# Patient Record
Sex: Male | Born: 1940 | Race: White | Hispanic: No | Marital: Married | Smoking: Former smoker
Health system: Southern US, Community
[De-identification: ages and names within clinical notes are randomized; demographics above are authoritative.]

## PROBLEM LIST (undated history)

## (undated) DIAGNOSIS — G459 Transient cerebral ischemic attack, unspecified: Secondary | ICD-10-CM

## (undated) DIAGNOSIS — E7800 Pure hypercholesterolemia, unspecified: Secondary | ICD-10-CM

## (undated) DIAGNOSIS — F329 Major depressive disorder, single episode, unspecified: Secondary | ICD-10-CM

## (undated) DIAGNOSIS — E669 Obesity, unspecified: Secondary | ICD-10-CM

## (undated) DIAGNOSIS — R112 Nausea with vomiting, unspecified: Secondary | ICD-10-CM

## (undated) DIAGNOSIS — H539 Unspecified visual disturbance: Secondary | ICD-10-CM

## (undated) DIAGNOSIS — F32A Depression, unspecified: Secondary | ICD-10-CM

## (undated) DIAGNOSIS — K219 Gastro-esophageal reflux disease without esophagitis: Secondary | ICD-10-CM

## (undated) DIAGNOSIS — R197 Diarrhea, unspecified: Secondary | ICD-10-CM

## (undated) DIAGNOSIS — K5792 Diverticulitis of intestine, part unspecified, without perforation or abscess without bleeding: Secondary | ICD-10-CM

## (undated) DIAGNOSIS — E785 Hyperlipidemia, unspecified: Secondary | ICD-10-CM

## (undated) DIAGNOSIS — I2510 Atherosclerotic heart disease of native coronary artery without angina pectoris: Secondary | ICD-10-CM

## (undated) DIAGNOSIS — I10 Essential (primary) hypertension: Secondary | ICD-10-CM

## (undated) DIAGNOSIS — E119 Type 2 diabetes mellitus without complications: Secondary | ICD-10-CM

## (undated) HISTORY — DX: Diverticulitis of intestine, part unspecified, without perforation or abscess without bleeding: K57.92

## (undated) HISTORY — DX: Pure hypercholesterolemia, unspecified: E78.00

## (undated) HISTORY — DX: Nausea with vomiting, unspecified: R11.2

## (undated) HISTORY — DX: Type 2 diabetes mellitus without complications: E11.9

## (undated) HISTORY — PX: APPENDECTOMY: SHX54

## (undated) HISTORY — DX: Diarrhea, unspecified: R19.7

## (undated) HISTORY — DX: Gastro-esophageal reflux disease without esophagitis: K21.9

## (undated) HISTORY — DX: Atherosclerotic heart disease of native coronary artery without angina pectoris: I25.10

## (undated) HISTORY — PX: CARDIAC CATHETERIZATION: SHX172

## (undated) HISTORY — DX: Major depressive disorder, single episode, unspecified: F32.9

## (undated) HISTORY — PX: COLONOSCOPY: SHX174

## (undated) HISTORY — PX: EYE SURGERY: SHX253

## (undated) HISTORY — PX: OTHER SURGICAL HISTORY: SHX169

## (undated) HISTORY — DX: Obesity, unspecified: E66.9

## (undated) HISTORY — PX: LUMBAR FUSION: SHX111

## (undated) HISTORY — DX: Transient cerebral ischemic attack, unspecified: G45.9

## (undated) HISTORY — DX: Essential (primary) hypertension: I10

## (undated) HISTORY — DX: Depression, unspecified: F32.A

---

## 2004-12-16 ENCOUNTER — Ambulatory Visit: Payer: Self-pay | Admitting: Family Medicine

## 2006-01-28 DIAGNOSIS — I2510 Atherosclerotic heart disease of native coronary artery without angina pectoris: Secondary | ICD-10-CM

## 2006-01-28 HISTORY — DX: Atherosclerotic heart disease of native coronary artery without angina pectoris: I25.10

## 2006-02-17 ENCOUNTER — Ambulatory Visit (HOSPITAL_COMMUNITY): Admission: RE | Admit: 2006-02-17 | Discharge: 2006-02-18 | Payer: Self-pay | Admitting: Cardiology

## 2006-02-17 ENCOUNTER — Inpatient Hospital Stay (HOSPITAL_BASED_OUTPATIENT_CLINIC_OR_DEPARTMENT_OTHER): Admission: RE | Admit: 2006-02-17 | Discharge: 2006-02-17 | Payer: Self-pay | Admitting: Cardiology

## 2007-12-01 DIAGNOSIS — G459 Transient cerebral ischemic attack, unspecified: Secondary | ICD-10-CM

## 2007-12-01 HISTORY — DX: Transient cerebral ischemic attack, unspecified: G45.9

## 2008-11-19 ENCOUNTER — Inpatient Hospital Stay: Payer: Self-pay | Admitting: Internal Medicine

## 2010-03-04 ENCOUNTER — Encounter: Admission: RE | Admit: 2010-03-04 | Discharge: 2010-03-04 | Payer: Self-pay | Admitting: Cardiology

## 2010-08-25 ENCOUNTER — Ambulatory Visit: Payer: Self-pay | Admitting: Gastroenterology

## 2010-09-01 ENCOUNTER — Ambulatory Visit: Payer: Self-pay | Admitting: Cardiology

## 2011-03-02 ENCOUNTER — Emergency Department (HOSPITAL_COMMUNITY): Payer: Medicare Other

## 2011-03-02 ENCOUNTER — Other Ambulatory Visit: Payer: Self-pay | Admitting: *Deleted

## 2011-03-02 ENCOUNTER — Observation Stay (HOSPITAL_COMMUNITY)
Admission: EM | Admit: 2011-03-02 | Discharge: 2011-03-03 | DRG: 392 | Disposition: A | Discharge status: Home / Self Care | Payer: Medicare Other | Source: Ambulatory Visit | Attending: Cardiology | Admitting: Cardiology

## 2011-03-02 DIAGNOSIS — E669 Obesity, unspecified: Secondary | ICD-10-CM | POA: Insufficient documentation

## 2011-03-02 DIAGNOSIS — R61 Generalized hyperhidrosis: Secondary | ICD-10-CM | POA: Insufficient documentation

## 2011-03-02 DIAGNOSIS — R079 Chest pain, unspecified: Principal | ICD-10-CM | POA: Insufficient documentation

## 2011-03-02 DIAGNOSIS — Z79899 Other long term (current) drug therapy: Secondary | ICD-10-CM | POA: Insufficient documentation

## 2011-03-02 DIAGNOSIS — F329 Major depressive disorder, single episode, unspecified: Secondary | ICD-10-CM | POA: Insufficient documentation

## 2011-03-02 DIAGNOSIS — I10 Essential (primary) hypertension: Secondary | ICD-10-CM

## 2011-03-02 DIAGNOSIS — E785 Hyperlipidemia, unspecified: Secondary | ICD-10-CM | POA: Insufficient documentation

## 2011-03-02 DIAGNOSIS — R0602 Shortness of breath: Secondary | ICD-10-CM | POA: Insufficient documentation

## 2011-03-02 DIAGNOSIS — F3289 Other specified depressive episodes: Secondary | ICD-10-CM | POA: Insufficient documentation

## 2011-03-02 DIAGNOSIS — R112 Nausea with vomiting, unspecified: Secondary | ICD-10-CM | POA: Insufficient documentation

## 2011-03-02 DIAGNOSIS — R110 Nausea: Secondary | ICD-10-CM | POA: Insufficient documentation

## 2011-03-02 DIAGNOSIS — I2510 Atherosclerotic heart disease of native coronary artery without angina pectoris: Secondary | ICD-10-CM | POA: Insufficient documentation

## 2011-03-02 DIAGNOSIS — Z9861 Coronary angioplasty status: Secondary | ICD-10-CM | POA: Insufficient documentation

## 2011-03-02 LAB — COMPREHENSIVE METABOLIC PANEL
ALT: 30 U/L (ref 0–53)
AST: 28 U/L (ref 0–37)
Albumin: 4 g/dL (ref 3.5–5.2)
BUN: 13 mg/dL (ref 6–23)
CO2: 28 mEq/L (ref 19–32)
Calcium: 9.1 mg/dL (ref 8.4–10.5)
Creatinine, Ser: 1.34 mg/dL (ref 0.4–1.5)
GFR calc non Af Amer: 53 mL/min — ABNORMAL LOW (ref >60)
Sodium: 138 mEq/L (ref 135–145)
Total Protein: 6.5 g/dL (ref 6.0–8.3)

## 2011-03-02 LAB — CK TOTAL AND CKMB (NOT AT ARMC): CK, MB: 3.9 ng/mL (ref 0.3–4.0)

## 2011-03-02 LAB — CBC
HCT: 44 % (ref 39.0–52.0)
MCHC: 34.5 g/dL (ref 30.0–36.0)
MCV: 92.1 fL (ref 78.0–100.0)
RDW: 12.4 % (ref 11.5–15.5)
WBC: 6.5 10*3/uL (ref 4.0–10.5)

## 2011-03-02 LAB — DIFFERENTIAL: Eosinophils Relative: 3 % (ref 0–5)

## 2011-03-02 LAB — URINALYSIS, ROUTINE W REFLEX MICROSCOPIC
Glucose, UA: NEGATIVE mg/dL
Specific Gravity, Urine: 1.017 (ref 1.005–1.030)

## 2011-03-02 LAB — TROPONIN I: Troponin I: 0.01 ng/mL (ref 0.00–0.06)

## 2011-03-02 LAB — HEMOGLOBIN A1C: Mean Plasma Glucose: 134 mg/dL — ABNORMAL HIGH (ref <117)

## 2011-03-02 LAB — POCT CARDIAC MARKERS: Myoglobin, poc: 172 ng/mL (ref 12–200)

## 2011-03-02 MED ORDER — VALSARTAN 80 MG PO TABS: 80.0000 mg | ORAL_TABLET | Freq: Every day | ORAL | Status: DC

## 2011-03-03 LAB — TSH: TSH: 1.02 u[IU]/mL (ref 0.350–4.500)

## 2011-03-03 LAB — CARDIAC PANEL(CRET KIN+CKTOT+MB+TROPI)
Relative Index: 1.2 (ref 0.0–2.5)
Troponin I: 0.01 ng/mL (ref 0.00–0.06)

## 2011-03-10 ENCOUNTER — Ambulatory Visit (HOSPITAL_COMMUNITY): Payer: Medicare Other | Attending: Cardiology | Admitting: Radiology

## 2011-03-10 DIAGNOSIS — I2510 Atherosclerotic heart disease of native coronary artery without angina pectoris: Secondary | ICD-10-CM

## 2011-03-10 DIAGNOSIS — R0789 Other chest pain: Secondary | ICD-10-CM

## 2011-03-10 DIAGNOSIS — R079 Chest pain, unspecified: Secondary | ICD-10-CM | POA: Insufficient documentation

## 2011-03-10 DIAGNOSIS — I491 Atrial premature depolarization: Secondary | ICD-10-CM

## 2011-03-10 MED ORDER — TECHNETIUM TC 99M TETROFOSMIN IV KIT
33.0000 | PACK | Freq: Once | INTRAVENOUS | Status: AC | PRN
  Administered 2011-03-10: 33 via INTRAVENOUS

## 2011-03-10 MED ORDER — TECHNETIUM TC 99M TETROFOSMIN IV KIT
11.0000 | PACK | Freq: Once | INTRAVENOUS | Status: AC | PRN
  Administered 2011-03-10: 11 via INTRAVENOUS

## 2011-03-10 NOTE — Progress Notes (Signed)
[facility] SITE 3 NUCLEAR MED [address] [place] [address] [phone]  Cardiology Nuclear Med Study  Sean [name] is a 70 y.o. male [id number] 1940/12/19   Nuclear Med Background Indication for Stress Test:  Evaluation for Ischemia, PTCA/Stent Patency  and 03/03/11 Post Hospital: CP:MI r/o. History:  '07 Stent-RCA, 3/11 OZH:YQMVHQ, EF=71% Cardiac Risk Factors: Family History - CAD, History of Smoking, Hypertension, Lipids, Obesity and TIA  Symptoms:  Chest Pressure.  (last date of chest discomfort :none since discharge), Diaphoresis, Nausea and Vomiting   Nuclear Pre-Procedure Caffeine/Decaff Intake:  None NPO After: 7:30am   Lungs:  Clear IV 0.9% NS with Angio Cath:  18g  IV Site: R Antecubital  IV Started by:  Stanton Kidney, EMT-P  Chest Size (in):  46 Cup Size: n/a  Height: 5\' 9"  (1.753 m)  Weight:  228 lb (103.42 kg)  BMI:  Body mass index is 33.67 kg/(m^2). Tech Comments:  NA    Nuclear Med Study 1 or 2 day study: 1 day  Stress Test Type:  Stress  Reading MD: Charlton Haws, MD  Order Authorizing Provider:  Roger Shelter, MD  Resting Radionuclide: Technetium 43m Tetrofosmin  Resting Radionuclide Dose: 11.0 mCi   Stress Radionuclide:  Technetium 66m Tetrofosmin  Stress Radionuclide Dose: 33.0 mCi           Stress Protocol Rest HR: 65 Stress HR: 141  Rest BP: 118/90 Stress BP: 207/92  Exercise Time (min): 8:01 METS: 10.2   Predicted Max HR: 153 bpm % Max HR: 92.16 bpm Rate Pressure Product: 46962   Dose of Adenosine (mg):  n/a Dose of Lexiscan: n/a mg  Dose of Atropine (mg): n/a Dose of Dobutamine: n/a mcg/kg/min (at max HR)  Stress Test Technologist: [employer], CMA-N  Nuclear Technologist:  Doyne Keel, CNMT     Rest Procedure:  Myocardial perfusion imaging was performed at rest 45 minutes following the intravenous administration of Technetium 66m Tetrofosmin. Rest ECG: ? Prior SWMI.  Stress Procedure:  The patient exercised  for 8:01.  The patient stopped due to fatigue and denied any chest pain.  There were no significant ST-T wave changes, only rare PAC's.  He did have a mild hypertensive response to exercise, 207/92.  Technetium 97m Tetrofosmin was injected at peak exercise and myocardial perfusion imaging was performed after a brief delay. Stress ECG: No significant ST segment change suggestive of ischemia.  QPS Raw Data Images:  Normal; no motion artifact; normal heart/lung ratio. Stress Images:  Normal homogeneous uptake in all areas of the myocardium. Rest Images:  Normal homogeneous uptake in all areas of the myocardium. Subtraction (SDS):  Normal Transient Ischemic Dilatation (Normal <1.22):  0.77 Lung/Heart Ratio (Normal <0.45):  0.34  Quantitative Gated Spect Images QGS EDV:  69 ml QGS ESV:  23 ml QGS cine images:  NL LV Function; NL Wall Motion QGS EF: 67%  Impression Exercise Capacity:  Fair exercise capacity. BP Response:  Hypertensive blood pressure response. Clinical Symptoms:  There is dyspnea. ECG Impression:  No significant ST segment change suggestive of ischemia. Comparison with Prior Nuclear Study: No images to compare  Overall Impression:  Normal stress nuclear study.    Signed by Rea [employer]co on 03/10/2011 at 2:35 PM.   Charlton Haws

## 2011-03-11 ENCOUNTER — Encounter: Payer: Self-pay | Admitting: Cardiology

## 2011-03-11 ENCOUNTER — Ambulatory Visit (INDEPENDENT_AMBULATORY_CARE_PROVIDER_SITE_OTHER): Payer: Medicare Other | Admitting: Cardiology

## 2011-03-11 DIAGNOSIS — F32A Depression, unspecified: Secondary | ICD-10-CM | POA: Insufficient documentation

## 2011-03-11 DIAGNOSIS — R079 Chest pain, unspecified: Secondary | ICD-10-CM

## 2011-03-11 DIAGNOSIS — I10 Essential (primary) hypertension: Secondary | ICD-10-CM | POA: Insufficient documentation

## 2011-03-11 DIAGNOSIS — I2510 Atherosclerotic heart disease of native coronary artery without angina pectoris: Secondary | ICD-10-CM

## 2011-03-11 DIAGNOSIS — K219 Gastro-esophageal reflux disease without esophagitis: Secondary | ICD-10-CM

## 2011-03-11 DIAGNOSIS — E669 Obesity, unspecified: Secondary | ICD-10-CM | POA: Insufficient documentation

## 2011-03-11 DIAGNOSIS — R112 Nausea with vomiting, unspecified: Secondary | ICD-10-CM | POA: Insufficient documentation

## 2011-03-11 DIAGNOSIS — E7800 Pure hypercholesterolemia, unspecified: Secondary | ICD-10-CM | POA: Insufficient documentation

## 2011-03-11 DIAGNOSIS — F329 Major depressive disorder, single episode, unspecified: Secondary | ICD-10-CM | POA: Insufficient documentation

## 2011-03-11 DIAGNOSIS — I209 Angina pectoris, unspecified: Secondary | ICD-10-CM | POA: Insufficient documentation

## 2011-03-11 NOTE — Assessment & Plan Note (Signed)
Symptoms have improved on Protonix.

## 2011-03-11 NOTE — Assessment & Plan Note (Signed)
HaIs not had recurrence of chest pain. He is hospitalized overnight on March 02, 2011 for chest pain.

## 2011-03-11 NOTE — Assessment & Plan Note (Signed)
Stress Cardiolite study was normal. We'll tentatively make an appointment for him to see Dr. Mariah Milling in [facility] for followup. He had previous Cypher stent to his proximalright coronary artery with angioplasty of his distal right coronary artery in March of 2007.

## 2011-03-11 NOTE — Discharge Summary (Signed)
Sean [name], WHYTE              ACCOUNT NO.:  [id number]  MEDICAL RECORD NO.:  [id number]           PATIENT TYPE:  I  LOCATION:  2023                         FACILITY:  [facility]  PHYSICIAN:  Colleen Can. Deborah Chalk, M.D.DATE OF BIRTH:  01-11-1941  DATE OF ADMISSION:  03/02/2011 DATE OF DISCHARGE:  03/03/2011                              DISCHARGE SUMMARY   PRIMARY CARDIOLOGIST:  Colleen Can. Deborah Chalk, MD  DISCHARGE DIAGNOSIS:  Chest pain, ruled out for myocardial infarction, felt to be secondary to gastroesophageal reflux disease.  SECONDARY DIAGNOSES: 1. Coronary artery disease status post percutaneous coronary     intervention to the right coronary artery in March 2007. 2. Hypertension. 3. Hypercholesterolemia. 4. Obesity. 5. Depression.  ALLERGIES:  No known drug allergies.  PROCEDURE AND DIAGNOSTICS PERFORMED DURING HOSPITALIZATION:  Chest x-ray on March 02, 2011:  No evidence of acute cardiac or pulmonary process.  REASON FOR HOSPITALIZATION:  This is a 70 year old gentleman with the above-stated problem list who states he had been doing fairly well until he had an episode of central chest pain while eating.  He subsequently had nausea and vomiting.  The patient denied any shortness of breath. He denied any recent fevers or chills.  The patient came to the emergency department for further evaluation.  Upon arrival to the emergency room, he was chest pain free.  His EKG was without acute changes.  His cardiac enzymes were negative x1.  The patient was admitted by the Cardiology Service for further evaluation.  HOSPITAL COURSE:  The patient ruled out for myocardial infarction with cardiac enzymes being negative.  The patient had no further complaints of chest pain.  With his symptoms being atypical, it was felt that the patient's chest pain was secondary to esophageal spasm or gastroesophageal reflux disease.  The patient was started on a proton pump at inhibitor.  He was  continued on his home medications of aspirin and Plavix.  On day of discharge, Dr. Deborah Chalk evaluated the patient, noted him stable for home.  He will be continued on a proton pump inhibitor.  He will be scheduled for outpatient Myoview for further risk stratification. Discharge plans and instructions have been discussed with the patient and his wife and they voiced understanding.  No beta-blocker has been used secondary to borderline bradycardia.  DISCHARGE LABORATORY DATA:  Cardiac enzymes negative x2.  TSH 1.020. hemoglobin A1c 6.3.  DISCHARGE MEDICATIONS: 1. Nitroglycerin 0.4 mg tablet sublingual 1 tablet under tongue at the     onset of chest pain, may repeat every 5 minutes for up to three     doses. 2. Protonix 40 mg daily. 3. Aspirin 325 mg daily. 4. Diovan 80 mg daily. 5. Fenofibrate 160 mg daily. 6. Glucosamine/chondroitin 2 capsules every morning. 7. Multivitamin daily. 8. Paroxetine 10 mg half a tablet every morning. 9. Plavix 75 mg every morning. 10.Simvastatin 80 mg at bedtime.  FOLLOWUP PLANS AND INSTRUCTIONS: 1. The patient will have a stress test at [facility] on March 10, 2011, at 11:45 a.m.  He is not to eat or drink 12 hours prior  to test.  He is not to have caffeine 24 hours prior to test. 2. The patient will follow up with Dr. Deborah Chalk on March 11, 2011, at     1:45 to review stress test results. 3. The patient is to increase activity slowly.  He is to stop any     activity that causes chest pain or shortness of breath. 4. The patient is to continue a low-sodium, heart-healthy diet.  DURATION OF DISCHARGE:  Greater than thirty minutes with physician and physician extender time.     [name], PA-C   ______________________________ Colleen Can Deborah Chalk, M.D.    NB/MEDQ  D:  03/03/2011  T:  03/04/2011  Job:  [id number]  Electronically Signed by Alen Blew P.A. on 03/04/2011 10:42:04 AM Electronically Signed by Roger Shelter M.D. on 03/11/2011 11:20:08 AM

## 2011-03-11 NOTE — Progress Notes (Signed)
Subjective:   He was hospitalized overnight on March 02, 2011 with chest pain. Cardiac markers were all negative. Stress Myoview was performed on April 10 and was negative for ischemia. He an ejection fraction of 67%. He had good exercise tolerance. He is had previous Cypher stent his proximal right coronary artery and angioplasty of his distal right coronary artery in March of 2007. He does have a history of hypertension, hypercholesterolemia, obesity, and intermittent depression.  Current Outpatient Prescriptions  Medication Sig Dispense Refill  . aspirin 325 MG tablet Take 325 mg by mouth daily.        . clopidogrel (PLAVIX) 75 MG tablet Take 75 mg by mouth daily.        . fenofibrate 160 MG tablet Take 160 mg by mouth daily.        Marland Kitchen GLUCOSAMINE-CHONDROITIN PO Take by mouth daily. 2 CAPS DAILY       . Multiple Vitamin (MULTIVITAMIN) tablet Take 1 tablet by mouth daily.        . nitroGLYCERIN (NITROSTAT) 0.4 MG SL tablet Place 0.4 mg under the tongue every 5 (five) minutes as needed.        . pantoprazole (PROTONIX) 40 MG tablet Take 40 mg by mouth daily.        Marland Kitchen PARoxetine (PAXIL) 10 MG tablet Take 5 mg by mouth every morning.        . simvastatin (ZOCOR) 80 MG tablet Take 80 mg by mouth at bedtime.        . valsartan (DIOVAN) 80 MG tablet Take 1 tablet (80 mg total) by mouth daily.  30 tablet  6    No Known Allergies  Patient Active Problem List  Diagnoses  . Chest pain  . GERD (gastroesophageal reflux disease)  . Coronary artery disease  . Hypertension  . Hypercholesterolemia  . Obesity  . Depression  . Nausea & vomiting    History  Smoking status  . Former Smoker  . Types: Cigarettes  . Quit date: 03/10/1981  Smokeless tobacco  . Not on file    History  Alcohol Use  . Yes    OCCASSIONALLY    No family history on file.  Review of Systems:   The patient denies any heat or cold intolerance.  No weight gain or weight loss.  The patient denies headaches or blurry  vision.  There is no cough or sputum production.  The patient denies dizziness.  There is no hematuria or hematochezia.  The patient denies any muscle aches or arthritis.  The patient denies any rash.  The patient denies frequent falling or instability.  There is no history of depression or anxiety.  All other systems were reviewed and are negative.   Physical Exam:   Weight is 229. Blood pressure is 126/80 sitting, heart rates 80.The head is normocephalic and atraumatic.  Pupils are equally round and reactive to light.  Sclerae nonicteric.  Conjunctiva is clear.  Oropharynx is unremarkable.  There's adequate oral airway.  Neck is supple there are no masses.  Thyroid is not enlarged.  There is no lymphadenopathy.  Lungs are clear.  Chest is symmetric.  Heart shows a regular rate and rhythm.  S1 and S2 are normal.  There is no murmur click or gallop.  Abdomen is soft normal bowel sounds.  There is no organomegaly.  Genital and rectal deferred.  Extremities are without edema.  Peripheral pulses are adequate.  Neurologically intact.  Full range of motion.  The patient  is not depressed.  Skin is warm and dry. Assessment / Plan:

## 2011-03-11 NOTE — Progress Notes (Signed)
NUCLEAR MED. STUDY SENT TO DR. Deborah Chalk. Sean [name]

## 2011-03-12 ENCOUNTER — Encounter (HOSPITAL_COMMUNITY): Payer: Self-pay | Admitting: Cardiology

## 2011-03-16 NOTE — H&P (Signed)
Sean [name], Sean [name]              ACCOUNT NO.:  [id number]  MEDICAL RECORD NO.:  [id number]           PATIENT TYPE:  I  LOCATION:  2023                         FACILITY:  [facility]  PHYSICIAN:  Marca Ancona, MD      DATE OF BIRTH:  10/01/1941  DATE OF ADMISSION:  03/02/2011 DATE OF DISCHARGE:                             HISTORY & PHYSICAL   PRIMARY CARDIOLOGIST:  Colleen Can. Deborah Chalk, MD  PRIMARY CARE PHYSICIAN:  Dr. Burnett Sheng in [place].  HISTORY OF PRESENT ILLNESS:  This is a 70 year old with a history of coronary artery disease status post PCI to the RCA and the PDA in 2007 as well as hypertension and hyperlipidemia who presents today with chest pain.  The patient has actually been doing quite well recently.  He has had no exertional chest pain and no dyspnea with exertion.  Within recent memory, he was sitting down for lunch today, he had pot roast and fried okra.  While he was eating, he developed severe pain up and down the central chest.  This waxed and waned and spasms and lasted for about 30 minutes.  It was like a pressure.  He vomited 4 times.  He felt clammy.  The pain did not resolve until he arrived in the emergency department and vomited the fourth time.  After this, the pain went away. He does not have a history of chest pain with swallowing.  Does not have a history of food getting stuck in his throat.  He has occasional GERD, not particularly severe though.  As mentioned, no recent exertional chest pain or dyspnea.  He thinks in the last 1-2 years he has had a stress test with Dr. Deborah Chalk that was unremarkable.  Vitals today are stable.  His first of cardiac enzymes are negative.  His EKG is unremarkable.  PAST MEDICAL HISTORY: 1. Coronary artery disease.  The patient had unstable angina in March     2007.  He had a left heart catheterization with a 95% proximal RCA     stenosis and 70% PDA stenosis.  He had a Cypher drug-eluting stent     placed in his RCA and  he had angioplasty to the PDA.  He says he     had a stress test sometime in the last 1-2 years with Dr. Deborah Chalk     and was unremarkable. 2. Hypertension. 3. Obesity. 4. Hyperlipidemia. 5. Depression. 6. History of appendicitis.  ALLERGIES:  No known drug allergies.  MEDICATIONS: 1. Plavix 75 mg daily. 2. Paxil 5 mg daily. 3. Fenofibrate 160 mg daily. 4. Aspirin 325 mg daily. 5. Zocor 80 mg daily. 6. Valsartan 80 mg daily.  SOCIAL HISTORY:  The patient lives in [place] with his wife.  He owns [employer], early years/pre.  He quit smoking in 1995.  He rarely drinks alcohol.  FAMILY HISTORY:  Father had coronary artery disease, hypertension, and renal failure.  REVIEW OF SYSTEMS:  All systems were reviewed and were negative except as noted in the history of present illness..  PHYSICAL EXAMINATION:  VITAL SIGNS:  Temperature 98.3, pulse 65 and regular, blood pressure 119/72, and  ox saturation 99% on room air. GENERAL:  This is an obese male in no apparent distress. HEENT:  Normal exam. ABDOMEN:  Soft and nontender.  No hepatosplenomegaly.  Normal bowel sounds. NECK:  There is no thyromegaly or thyroid nodule.  There is no JVD. CARDIOVASCULAR:  Heart regular, S1 and S2.  No S3, no S4.  There is no murmur.  There are 2+ posterior tibial pulses bilaterally.  There is no carotid bruit.  There is no peripheral edema. LUNGS:  Clear to auscultation bilaterally with normal respiratory effort. EXTREMITIES:  No clubbing or cyanosis. SKIN:  Normal exam. NEUROLOGIC:  Alert and orient x3.  Normal affect.  RADIOLOGY:  Chest x-ray was clear with no pneumonia or pulmonary edema. EKG:  Normal sinus rhythm.  This is a normal EKG.  LABORATORY DATA:  White count 6.5, hematocrit 44, and platelets 249. Potassium 4.0 and creatinine 1.34.  LFTs are normal.  First set of cardiac enzymes and point-of-care markers are negative.  Urinalysis is negative.  IMPRESSION:  This is a 70 year old with a  history of coronary artery disease status post previous coronary intervention, hypertension, and hyperlipidemia who presents with chest pain.  The patient's symptoms are atypical.  Potentially, they are due to esophageal spasm or GERD.  He experienced spasms of the central chest while eating that were associated with vomiting.  The patient is now chest pain free.  EKG is normal.  Cardiac enzymes are so far negative.  We will cycle his cardiac enzymes and EKGs.  We will admit him overnight.  If his enzymes come back positive, we can start a heparin drip and Plavix, however, for now we will observe.  We will treat him with a proton pump inhibitor.  If his enzymes stay negative and EKG is unremarkable, he can go home in the morning for an outpatient Myoview.     Marca Ancona, MD     DM/MEDQ  D:  03/02/2011  T:  03/03/2011  Job:  [id number]  Electronically Signed by Marca Ancona MD on 03/16/2011 09:05:25 AM

## 2011-03-27 ENCOUNTER — Telehealth: Payer: Self-pay | Admitting: *Deleted

## 2011-03-27 NOTE — Telephone Encounter (Signed)
Pt notified of nuclear results and to f/u with Dr. Mariah Milling.

## 2011-04-17 NOTE — Discharge Summary (Signed)
Sean [name], Sean [name]              ACCOUNT NO.:  [id number]   MEDICAL RECORD NO.:  [id number]          PATIENT TYPE:  OIB   LOCATION:  6523                         FACILITY:  [facility]   PHYSICIAN:  Colleen Can. Deborah Chalk, M.D.DATE OF BIRTH:  02-May-1941   DATE OF ADMISSION:  02/17/2006  DATE OF DISCHARGE:  02/18/2006                           DISCHARGE SUMMARY - REFERRING   PRIMARY DISCHARGE DIAGNOSIS:  Chest pain with subsequent elective cardiac  catheterization with subsequent elective percutaneous coronary intervention  to the right coronary.   SECONDARY DISCHARGE DIAGNOSES:  1.  Hypertension.  2.  Obesity.  3.  Depression.   HISTORY OF PRESENT ILLNESS:  Sean [name] is a very pleasant 70 year old white  male with a history of chest pain over the past 2-3 weeks. He was seen in  the office on March 19 and was referred for elective cardiac  catheterization. He describes his discomfort as a pressure-like sensation  that would last for 2-3 minutes and then would resolve spontaneously. He  really had no other associated symptoms except for some radiation to the  left arm and shoulder. He was subsequently referred for diagnostic  catheterization.   Please see the dictated history and physical for the patient's presentation  and profile.   LABORATORY DATA:  On admission his PT and PTT were unremarkable. Chemistries  were normal. Total cholesterol was 239, triglycerides 409, HDL was 29, and  LDL was unable to be calculated. The CBC was normal.   HOSPITAL COURSE:  The patient was admitted initially to the [facility]  catheterization lab in order to undergo diagnostic catheterization, that  procedure was tolerated well without any known complications. Left main  coronary had irregularities but essentially was normal. The LAD had somewhat  of an atypical course that appeared at the proximal portion and coursed into  an intramyocardial location and was relatively small. It was a large  bifurcating  diagonal vessel with irregularities but no significant focal  disease. The left circumflex is a very small vessel with irregularities of  30-40% proximally. The intermediate is a small vessel with diffuse  irregularities but no greater than 30-50%. The right coronary artery is a  very large dominant vessel, there is a 95% eccentric plaque in the proximal  segment just prior to the crux, there were  irregularities and then there was a 70% narrowing in the posterior  descending. Post-procedure the patient did develop asymptomatic ST  elevation, this resolved with sublingual nitroglycerin. In light of this  response we proceeded on with emergent angioplasty to the right coronary  artery. A 3.5 x 18 mm Pheifer stent was placed at the proximal right  coronary. The posterior descending was dilated with a 2.5 Maverick balloon,  Pfeifer stent deployment was unsuccessful. There was a subintimal dissection  of the posterior descending but overall the result was felt to be  satisfactory. Post-procedure he was transferred to 6500, and today on February 18, 2006 he is doing well without complaints. Physical exam is unremarkable.  Groin has some mild ecchymosis. His post-procedural lab is satisfactory.  Post CK-MB is negative and he is felt  to be a satisfactory candidate for  discharge today.   DISCHARGE CONDITION:  Stable.   DISCHARGE MEDICATIONS:  Aspirin 81 mg daily, Plavix 75 mg daily,  multivitamin daily, Lipitor 10 daily, TricCor 145 daily, Atacand 16 mg  daily, Paxil 10 mg daily and nitroglycerin p.r.n.   DIET:  Low fat, heart healthy.   WOUND CARE:  He is to use an ice pack if needed to the groin.   ACTIVITY:  To be increased as tolerated.   FOLLOW UP:  Will plan on seeing him back in the office in 7-10 days.  Certainly sooner if any problems arise in the interim.      Sharlee Blew, N.P.      Colleen Can. Deborah Chalk, M.D.  Electronically Signed    LC/MEDQ  D:  02/18/2006  T:   02/18/2006  Job:  [id number]   cc:   [name]  Fax: [phone]

## 2011-04-17 NOTE — H&P (Signed)
[name], [name]              ACCOUNT NO.:  [id number]   MEDICAL RECORD NO.:  [id number]            PATIENT TYPE:   LOCATION:                                 FACILITY:   PHYSICIAN:  Colleen Can. Deborah Chalk, M.D.DATE OF BIRTH:  Feb 26, 1941   DATE OF ADMISSION:  02/17/2006  DATE OF DISCHARGE:                                HISTORY & PHYSICAL   CHIEF COMPLAINT:  Chest pain.   HISTORY OF PRESENT ILLNESS:  Sean [name] is a pleasant 70 year old white male  who presents for elective cardiac catheterization.  He has history of  hypertension, obesity, and hypercholesterolemia.  He presented for  evaluation with complaints of chest pain and pressure over the past two to  three weeks.  He has had no precipitating factors.  Discomfort lasts for  approximately two to three minutes and is described as a pressure-like  sensation.  It basically resolves spontaneously.  He has had radiation to  the left arm and shoulder, but no nausea, no vomiting, no shortness of  breath.  It has not been related to meals.  He has had previous stress  Cardiolite studies, his last being in February or March of 2006 at [facility].   PAST MEDICAL HISTORY:  1.  Hypertension.  2.  Hyperlipidemia.  3.  Depression.   PAST SURGICAL HISTORY:  Appendectomy.   ALLERGIES:  None.   CURRENT MEDICATIONS:  Atacand, Paxil, aspirin, and multivitamin daily.   FAMILY HISTORY:  Father died at 33 and had a history of heart disease,  hypertension, and kidney disease as well as diabetes.  Mother is alive at  age 20.   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  He quit smoking 12 years ago, but prior to that smoked  cigarettes for 10-15 years and has smoked cigars for over 30.  He does have  occasional alcohol use.   REVIEW OF SYSTEMS:  As noted above and is otherwise unremarkable.   PHYSICAL EXAMINATION:  GENERAL:  He is a middle-aged male.  He is obese.  He  is  currently in no acute distress.  VITAL SIGNS:  Blood pressure 138/82 sitting, 130/78 standing, heart rate 78,  respirations 18.  He is afebrile.  SKIN:  Warm and dry.  Color is unremarkable.  LUNGS:  Clear.  HEART:  Regular rhythm.  ABDOMEN:  Soft.  Positive bowel sounds, nontender.  EXTREMITIES:  Without edema.  NEUROLOGIC:  No gross focal deficits.   LABORATORIES:  Pending.   12-lead electrocardiogram shows normal sinus rhythm.   OVERALL IMPRESSION:  1.  Atypical chest pain.  2.  Obesity.  3.  Hypertension.  4.  Reported hyperlipidemia currently without medical management.   PLAN:  Will proceed on with diagnostic catheterization.  Procedure has been  reviewed in full detail and he is willing to proceed on Wednesday, February 17, 2006.      Sharlee Blew, N.P.      Colleen Can. Deborah Chalk, M.D.  Electronically Signed    LC/MEDQ  D:  02/15/2006  T:  02/15/2006  Job:  [id number]

## 2011-04-17 NOTE — Cardiovascular Report (Signed)
NAMEART, LEVAN              ACCOUNT NO.:  [id number]   MEDICAL RECORD NO.:  [id number]          PATIENT TYPE:  OIB   LOCATION:  1961                         FACILITY:  [facility]   PHYSICIAN:  Colleen Can. Deborah Chalk, M.D.DATE OF BIRTH:  Jul 02, 1941   DATE OF PROCEDURE:  02/17/2006  DATE OF DISCHARGE:  02/17/2006                              CARDIAC CATHETERIZATION   PROCEDURE:  Left heart catheterization with selective coronary angiography,  left ventricular angiography.   TYPE/SITE OF ENTRY:  Percutaneous right femoral artery.   CATHETERS:  4-French #4 curved Judkins right and left coronary catheters, 4-  French pigtail ventriculographic catheter.   CONTRAST MATERIAL:  Omnipaque.   MEDICATIONS:  Given prior procedure, Valium 10 milligrams. Medications given  during the procedure, Versed 2 milligrams IV.   COMMENTS:  The patient tolerated the procedure well.   HEMODYNAMIC DATA:  Aortic pressure was 98/60, LV was 98/6-10. There was no  aortic valve gradient noted on pullback.   ANGIOGRAPHIC DATA:  1.  Left main coronary artery had irregularities but essentially was normal.  2.  Left anterior descending: Left anterior descending had a somewhat      atypical course. It appeared that the proximal portion of the left      anterior descending and what would be the left anterior descending      itself coursed in an intramyocardial location was relatively small.      There was a large bifurcating diagonal vessel with irregularities but no      significant focal disease.  3.  Left circumflex: Left circumflex was a very small vessel. There were      irregularities of 30-40% nature proximally. There was a small posterior      lateral branch noted.  4.  Intermediate: There is a small intermediate vessel with diffuse      irregularities but no greater than 30-50% narrowing.  5.  Right coronary artery: The right coronary artery is very large dominant      vessel. There was a 95% eccentric  plaque in the proximal segment. Just      prior to the crux, there were irregularities and then in the posterior      descending there appeared to be a 70% narrowing. Posterior descending      would appear to be an approximately 2.5 mm vessel and it may be suitable      for stent placement but will have to be reassessed.   OVERALL IMPRESSION:  1.  Normal left ventricular function.  2.  Severe stenosis in the proximal right coronary artery and moderately      severe stenosis in the proximal posterior descending.  3.  Mild diffuse atherosclerosis in the left coronary system.   DISCUSSION:  We will refer Mr. Kester for percutaneous coronary  intervention. While making this decision, we noted asymptomatic ST-segment  elevation and resolution with nitroglycerin. In light of those findings, we  will proceed on in a more urgent nature.      Colleen Can. Deborah Chalk, M.D.  Electronically Signed     SNT/MEDQ  D:  02/17/2006  T:  02/17/2006  Job:  [id number]

## 2011-04-22 ENCOUNTER — Telehealth: Payer: Self-pay | Admitting: Cardiovascular Disease

## 2011-04-22 NOTE — Telephone Encounter (Signed)
Pt has been notified of New Pt appt tomorrow.

## 2011-04-23 ENCOUNTER — Encounter: Payer: Medicare Other | Admitting: Cardiovascular Disease

## 2011-05-29 ENCOUNTER — Encounter: Payer: Self-pay | Admitting: Cardiovascular Disease

## 2011-05-29 ENCOUNTER — Ambulatory Visit (INDEPENDENT_AMBULATORY_CARE_PROVIDER_SITE_OTHER): Payer: Medicare Other | Admitting: Cardiovascular Disease

## 2011-05-29 DIAGNOSIS — I10 Essential (primary) hypertension: Secondary | ICD-10-CM

## 2011-05-29 DIAGNOSIS — K219 Gastro-esophageal reflux disease without esophagitis: Secondary | ICD-10-CM

## 2011-05-29 DIAGNOSIS — E669 Obesity, unspecified: Secondary | ICD-10-CM

## 2011-05-29 DIAGNOSIS — E7800 Pure hypercholesterolemia, unspecified: Secondary | ICD-10-CM

## 2011-05-29 DIAGNOSIS — R079 Chest pain, unspecified: Secondary | ICD-10-CM

## 2011-05-29 DIAGNOSIS — I2510 Atherosclerotic heart disease of native coronary artery without angina pectoris: Secondary | ICD-10-CM

## 2011-05-29 NOTE — Assessment & Plan Note (Signed)
Would continue the PPI.

## 2011-05-29 NOTE — Assessment & Plan Note (Signed)
Blood pressure is well controlled on today's visit. No changes made to the medications. 

## 2011-05-29 NOTE — Assessment & Plan Note (Signed)
Currently with no symptoms of angina. No further workup at this time. Continue current medication regimen. 

## 2011-05-29 NOTE — Assessment & Plan Note (Signed)
We have encouraged continued exercise, careful diet management in an effort to lose weight. 

## 2011-05-29 NOTE — Patient Instructions (Signed)
You are doing well. No medication changes were made. Please call us if you have new issues that need to be addressed before your next appt.  We will call you for a follow up Appt. In 12 months  

## 2011-05-29 NOTE — Assessment & Plan Note (Signed)
No further episodes of chest pain. He has started working out at [employer] and has no symptoms. We have encouraged him to contact us if he does develop any chest pain symptoms with exertion.

## 2011-05-29 NOTE — Assessment & Plan Note (Signed)
He is on simvastatin and a fibrate. I am concerned about his high dose statin, particularly with a fibrate. There is an FDA warning on 80 mg dosing. We'll try to obtain his most recent cholesterol numbers for our records.

## 2011-05-29 NOTE — Progress Notes (Signed)
   Patient ID: Sean [name], male    DOB: [birth year], 70 y.o.   MRN: [id number]  HPI Comments: 70 year old gentleman with history of coronary artery disease, stent to his RCA in 2007, normal stress test in March 2001, chest pain earlier this year with repeat stress testing showing no significant ischemia who presents to the office to establish care.  Overall he states that he is doing well. He is tolerating his medications without any symptoms. His blood pressure is usually very well controlled. He is uncertain what his cholesterol numbers are. He is joined the [employer] and started exercising 3 times per week. He does have a smoking history though stopped over 30 years ago. No further chest pain symptoms. He feels his symptoms earlier this year were secondary to GERD and he was started on a PPI with resolution of his symptoms.  Is otherwise active, helps to take care of 3 grandchildren.  EKG shows normal sinus rhythm with rate 77 beats per minute with no significant ST or T wave changes     Review of Systems  Constitutional: Negative.   HENT: Negative.   Eyes: Negative.   Respiratory: Negative.   Cardiovascular: Negative.   Gastrointestinal: Negative.   Musculoskeletal: Negative.   Skin: Negative.   Neurological: Negative.   Hematological: Negative.   Psychiatric/Behavioral: Negative.   All other systems reviewed and are negative.    BP 122/72  Pulse 77  Ht 5\' 9"  (1.753 m)  Wt 228 lb (103.42 kg)  BMI 33.67 kg/m2  Physical Exam  Nursing note and vitals reviewed. Constitutional: He is oriented to person, place, and time. He appears well-developed and well-nourished.  HENT:  Head: Normocephalic.  Nose: Nose normal.  Mouth/Throat: Oropharynx is clear and moist.  Eyes: Conjunctivae are normal. Pupils are equal, round, and reactive to light.  Neck: Normal range of motion. Neck supple. No JVD present.  Cardiovascular: Normal rate, regular rhythm, S1 normal, S2 normal, normal heart  sounds and intact distal pulses.  Exam reveals no gallop and no friction rub.   No murmur heard. Pulmonary/Chest: Effort normal and breath sounds normal. No respiratory distress. He has no wheezes. He has no rales. He exhibits no tenderness.  Abdominal: Soft. Bowel sounds are normal. He exhibits no distension. There is no tenderness.  Musculoskeletal: Normal range of motion. He exhibits no edema and no tenderness.  Lymphadenopathy:    He has no cervical adenopathy.  Neurological: He is alert and oriented to person, place, and time. Coordination normal.  Skin: Skin is warm and dry. No rash noted. No erythema.  Psychiatric: He has a normal mood and affect. His behavior is normal. Judgment and thought content normal.           Assessment and Plan

## 2011-08-04 ENCOUNTER — Other Ambulatory Visit: Payer: Self-pay | Admitting: *Deleted

## 2011-08-04 MED ORDER — CLOPIDOGREL BISULFATE 75 MG PO TABS: 75.0000 mg | ORAL_TABLET | Freq: Every day | ORAL | Status: DC

## 2011-08-04 NOTE — Telephone Encounter (Signed)
escribe medication per fax request  

## 2011-09-15 ENCOUNTER — Other Ambulatory Visit: Payer: Self-pay | Admitting: Gastroenterology

## 2011-09-18 ENCOUNTER — Ambulatory Visit
Admission: RE | Admit: 2011-09-18 | Discharge: 2011-09-18 | Disposition: A | Discharge status: Home / Self Care | Payer: Medicare Other | Source: Ambulatory Visit | Attending: Gastroenterology | Admitting: Gastroenterology

## 2011-09-18 MED ORDER — IOHEXOL 300 MG/ML  SOLN
100.0000 mL | Freq: Once | INTRAMUSCULAR | Status: AC | PRN
  Administered 2011-09-18: 100 mL via INTRAVENOUS

## 2011-10-05 ENCOUNTER — Telehealth: Payer: Self-pay

## 2011-10-05 DIAGNOSIS — I10 Essential (primary) hypertension: Secondary | ICD-10-CM

## 2011-10-05 MED ORDER — VALSARTAN 80 MG PO TABS: 80.0000 mg | ORAL_TABLET | Freq: Every day | ORAL | Status: DC

## 2011-10-05 NOTE — Telephone Encounter (Signed)
Refill sent for diovan 80 mg take one tablet daily.

## 2011-10-06 ENCOUNTER — Other Ambulatory Visit: Payer: Self-pay | Admitting: Cardiovascular Disease

## 2011-10-06 MED ORDER — PANTOPRAZOLE SODIUM 40 MG PO TBEC: 40.0000 mg | DELAYED_RELEASE_TABLET | Freq: Every day | ORAL | Status: DC

## 2011-12-16 ENCOUNTER — Encounter (INDEPENDENT_AMBULATORY_CARE_PROVIDER_SITE_OTHER): Payer: Self-pay | Admitting: General Surgery

## 2011-12-16 ENCOUNTER — Ambulatory Visit (INDEPENDENT_AMBULATORY_CARE_PROVIDER_SITE_OTHER): Payer: Medicare Other | Admitting: General Surgery

## 2011-12-16 VITALS — BP 116/86 | HR 77 | Temp 99.0°F | Ht 69.0 in | Wt 228.8 lb

## 2011-12-16 DIAGNOSIS — K5732 Diverticulitis of large intestine without perforation or abscess without bleeding: Secondary | ICD-10-CM

## 2011-12-16 NOTE — Progress Notes (Signed)
Patient ID: Sean [name], male   DOB: [birth year], 71 y.o.   MRN: [id number]  Chief Complaint  Patient presents with  . Pre-op Exam    eval recurrent diverticulitis    HPI Sean [name] is a 71 y.o. male.   HPIPatient is a pleasant 71 year old male who has had significant issues with recurrent sigmoid diverticulitis. He has had 4 attacks since last night. Please have all been treated with outpatient antibiotics. He has not required hospitalization. CT scan of the abdomen and pelvis was obtained showing sigmoid diverticulosis without acute diverticulitis. This was done in October of 2012. The patient has also had some diarrhea. Colonoscopy according to Dr. Hulen Shouts notes showed some right-sided and left-sided diverticuli. Only diverticuli noted as seen on CT scan were in the sigmoid colon however.  Past Medical History  Diagnosis Date  . Chest pain   . GERD (gastroesophageal reflux disease)   . Coronary artery disease 01/2006    STATUS POST PERCUTANEOUS CORONARY INTERVENTION TO THE RIGHT CORONARY ARTERY  . Hypertension   . Hypercholesterolemia   . Obesity   . Depression   . Nausea & vomiting   . TIA (transient ischemic attack) 2009  . Diverticulitis   . Diarrhea     Past Surgical History  Procedure Date  . Appendectomy   . Lumbar fusion   . Cardiac catheterization     Family History  Problem Relation Age of Onset  . Hypertension Father     Social History History  Substance Use Topics  . Smoking status: Former Smoker    Types: Cigarettes    Quit date: 03/10/1981  . Smokeless tobacco: Not on file  . Alcohol Use: Yes     OCCASSIONALLY    No Known Allergies  Current Outpatient Prescriptions  Medication Sig Dispense Refill  . aspirin 325 MG tablet Take 325 mg by mouth daily.        . clopidogrel (PLAVIX) 75 MG tablet Take 1 tablet (75 mg total) by mouth daily.  30 tablet  6  . fenofibrate 160 MG tablet Take 160 mg by mouth daily.        Marland Kitchen  GLUCOSAMINE-CHONDROITIN PO Take by mouth daily. 2 CAPS DAILY       . Multiple Vitamin (MULTIVITAMIN) tablet Take 1 tablet by mouth daily.        . nitroGLYCERIN (NITROSTAT) 0.4 MG SL tablet Place 0.4 mg under the tongue every 5 (five) minutes as needed.        . pantoprazole (PROTONIX) 40 MG tablet Take 1 tablet (40 mg total) by mouth daily.  30 tablet  3  . PARoxetine (PAXIL) 10 MG tablet Take 5 mg by mouth every morning.        . simvastatin (ZOCOR) 80 MG tablet Take 80 mg by mouth at bedtime.        . valsartan (DIOVAN) 80 MG tablet Take 1 tablet (80 mg total) by mouth daily.  30 tablet  6    Review of Systems Review of Systems  Constitutional: Negative for fever, chills and unexpected weight change.  HENT: Negative for hearing loss, congestion, sore throat, trouble swallowing and voice change.   Eyes: Negative for visual disturbance.  Respiratory: Negative for cough and wheezing.   Cardiovascular: Negative for chest pain, palpitations and leg swelling.  Gastrointestinal: Positive for abdominal pain and diarrhea. Negative for nausea, vomiting, constipation, blood in stool, abdominal distention, anal bleeding and rectal pain.       History of  Present illness  Genitourinary: Negative for hematuria and difficulty urinating.  Musculoskeletal: Negative for arthralgias.  Skin: Negative for rash and wound.  Neurological: Negative for seizures, syncope, weakness and headaches.  Hematological: Negative for adenopathy. Does not bruise/bleed easily.  Psychiatric/Behavioral: Negative for confusion.    Blood pressure 116/86, pulse 77, temperature 99 F (37.2 C), temperature source Temporal, height 5\' 9"  (1.753 m), weight 228 lb 12.8 oz (103.783 kg), SpO2 93.00%.  Physical Exam Physical Exam  Constitutional: He is oriented to person, place, and time. He appears well-developed and well-nourished.  HENT:  Head: Normocephalic and atraumatic.  Eyes: Conjunctivae and EOM are normal. Pupils are  equal, round, and reactive to light.  Neck: Normal range of motion. Neck supple. No tracheal deviation present.  Cardiovascular: Normal rate, regular rhythm and normal heart sounds.   Pulmonary/Chest: Effort normal and breath sounds normal. No respiratory distress. He has no wheezes. He has no rales.  Abdominal: Soft. Bowel sounds are normal. He exhibits no distension. There is tenderness. There is no rebound and no guarding.       Mild tenderness left lower quadrant and suprapubic without mass  Musculoskeletal: Normal range of motion.  Neurological: He is alert and oriented to person, place, and time.  Skin: Skin is warm and dry.    Data Reviewed CAT scan  And GI notes  Assessment    Recurrent sigmoid diverticulitis    Plan    I have offered sigmoid colectomy. Procedure was discussed in detail. We also discussed the risks and benefits. These included but were not limited to bleeding, infection, cardiac, pulmonary, anastomotic leak, injury to other organs.  He also need to come off his Plavix for 5 days preoperatively. He does have coronary artery disease. We will need to obtain cardiac clearance from Dr.Gollan. We also need to wait at least 4 weeks to allow this acute inflammation to subside. Once we obtain cardiac clearance, we will look forward to scheduling.       Aodhan Scheidt E 12/16/2011, 9:55 AM

## 2012-02-03 ENCOUNTER — Other Ambulatory Visit: Payer: Self-pay

## 2012-02-03 MED ORDER — PANTOPRAZOLE SODIUM 40 MG PO TBEC: 40.0000 mg | DELAYED_RELEASE_TABLET | Freq: Every day | ORAL | Status: DC

## 2012-03-07 ENCOUNTER — Other Ambulatory Visit: Payer: Self-pay | Admitting: *Deleted

## 2012-03-07 MED ORDER — CLOPIDOGREL BISULFATE 75 MG PO TABS: 75.0000 mg | ORAL_TABLET | Freq: Every day | ORAL | Status: DC

## 2012-03-23 ENCOUNTER — Other Ambulatory Visit: Payer: Self-pay | Admitting: Cardiology

## 2012-03-23 ENCOUNTER — Other Ambulatory Visit: Payer: Self-pay | Admitting: Nurse Practitioner

## 2012-03-23 MED ORDER — CLOPIDOGREL BISULFATE 75 MG PO TABS: 75.0000 mg | ORAL_TABLET | Freq: Every day | ORAL | Status: DC

## 2012-05-05 ENCOUNTER — Other Ambulatory Visit: Payer: Self-pay | Admitting: *Deleted

## 2012-05-05 ENCOUNTER — Other Ambulatory Visit: Payer: Self-pay | Admitting: Cardiology

## 2012-05-05 DIAGNOSIS — I10 Essential (primary) hypertension: Secondary | ICD-10-CM

## 2012-05-05 MED ORDER — VALSARTAN 80 MG PO TABS: 80.0000 mg | ORAL_TABLET | Freq: Every day | ORAL | Status: DC

## 2012-05-05 MED ORDER — SIMVASTATIN 80 MG PO TABS: 80.0000 mg | ORAL_TABLET | Freq: Every day | ORAL | Status: DC

## 2012-05-05 NOTE — Telephone Encounter (Signed)
Refilled Diovan

## 2012-05-12 ENCOUNTER — Other Ambulatory Visit: Payer: Self-pay | Admitting: *Deleted

## 2012-05-12 MED ORDER — FENOFIBRATE 160 MG PO TABS: 160.0000 mg | ORAL_TABLET | Freq: Every day | ORAL | Status: DC

## 2012-05-12 NOTE — Telephone Encounter (Signed)
Refilled Fenofibrate. 

## 2012-06-06 ENCOUNTER — Ambulatory Visit (INDEPENDENT_AMBULATORY_CARE_PROVIDER_SITE_OTHER): Payer: Medicare Other | Admitting: Cardiovascular Disease

## 2012-06-06 ENCOUNTER — Encounter: Payer: Self-pay | Admitting: Cardiovascular Disease

## 2012-06-06 VITALS — BP 118/72 | Ht 68.0 in | Wt 227.5 lb

## 2012-06-06 DIAGNOSIS — E7800 Pure hypercholesterolemia, unspecified: Secondary | ICD-10-CM

## 2012-06-06 DIAGNOSIS — I2510 Atherosclerotic heart disease of native coronary artery without angina pectoris: Secondary | ICD-10-CM

## 2012-06-06 DIAGNOSIS — I10 Essential (primary) hypertension: Secondary | ICD-10-CM

## 2012-06-06 DIAGNOSIS — E669 Obesity, unspecified: Secondary | ICD-10-CM

## 2012-06-06 NOTE — Addendum Note (Signed)
Addended by: Festus Aloe on: 06/06/2012 09:32 AM   Modules accepted: Orders

## 2012-06-06 NOTE — Progress Notes (Signed)
Patient ID: Sean [name], male    DOB: 01/06/1941, 71 y.o.   MRN: [id number]  HPI Comments: 71 year old gentleman with history of coronary artery disease, stent to his RCA in 2007, normal stress test in March 2001, chest pain earlier this year with repeat stress testing showing no significant ischemia who presents to the office for routine followup.  Overall he states that he is doing well. He is tolerating his medications without any symptoms. His blood pressure is usually very well controlled. He is uncertain what his cholesterol numbers are. He does work out at [employer], not as much as last year. No new complaints, no new issues. Overall he feels well.   EKG shows normal sinus rhythm with rate 74 beats per minute, no significant ST or T wave changes   Outpatient Encounter Prescriptions as of 06/06/2012  Medication Sig Dispense Refill  . aspirin 325 MG tablet Take 325 mg by mouth daily.        . clopidogrel (PLAVIX) 75 MG tablet Take 1 tablet (75 mg total) by mouth daily.  30 tablet  6  . fenofibrate 160 MG tablet Take 1 tablet (160 mg total) by mouth daily.  30 tablet  5  . GLUCOSAMINE-CHONDROITIN PO Take by mouth daily. 2 CAPS DAILY       . Multiple Vitamin (MULTIVITAMIN) tablet Take 1 tablet by mouth daily.        . nitroGLYCERIN (NITROSTAT) 0.4 MG SL tablet Place 0.4 mg under the tongue every 5 (five) minutes as needed.        . pantoprazole (PROTONIX) 40 MG tablet Take 1 tablet (40 mg total) by mouth daily.  30 tablet  3  . PARoxetine (PAXIL) 10 MG tablet Take 5 mg by mouth every morning.        . simvastatin (ZOCOR) 80 MG tablet Take 1 tablet (80 mg total) by mouth at bedtime.  30 tablet  1  . valsartan (DIOVAN) 80 MG tablet Take 1 tablet (80 mg total) by mouth daily.  30 tablet  6    Review of Systems  Constitutional: Negative.   HENT: Negative.   Eyes: Negative.   Respiratory: Negative.   Cardiovascular: Negative.   Gastrointestinal: Negative.   Musculoskeletal: Negative.    Skin: Negative.   Neurological: Negative.   Hematological: Negative.   Psychiatric/Behavioral: Negative.   All other systems reviewed and are negative.    BP 118/72  Ht 5\' 8"  (1.727 m)  Wt 227 lb 8 oz (103.193 kg)  BMI 34.59 kg/m2  Physical Exam  Nursing note and vitals reviewed. Constitutional: He is oriented to person, place, and time. He appears well-developed and well-nourished.  HENT:  Head: Normocephalic.  Nose: Nose normal.  Mouth/Throat: Oropharynx is clear and moist.  Eyes: Conjunctivae are normal. Pupils are equal, round, and reactive to light.  Neck: Normal range of motion. Neck supple. No JVD present.  Cardiovascular: Normal rate, regular rhythm, S1 normal, S2 normal, normal heart sounds and intact distal pulses.  Exam reveals no gallop and no friction rub.   No murmur heard. Pulmonary/Chest: Effort normal and breath sounds normal. No respiratory distress. He has no wheezes. He has no rales. He exhibits no tenderness.  Abdominal: Soft. Bowel sounds are normal. He exhibits no distension. There is no tenderness.  Musculoskeletal: Normal range of motion. He exhibits no edema and no tenderness.  Lymphadenopathy:    He has no cervical adenopathy.  Neurological: He is alert and oriented to person, place,  and time. Coordination normal.  Skin: Skin is warm and dry. No rash noted. No erythema.  Psychiatric: He has a normal mood and affect. His behavior is normal. Judgment and thought content normal.           Assessment and Plan

## 2012-06-06 NOTE — Assessment & Plan Note (Signed)
We have encouraged continued exercise, careful diet management in an effort to lose weight. 

## 2012-06-06 NOTE — Assessment & Plan Note (Signed)
We'll try to obtain his most recent lipid panel for our records. 

## 2012-06-06 NOTE — Assessment & Plan Note (Signed)
Blood pressure is well controlled on today's visit. No changes made to the medications. 

## 2012-06-06 NOTE — Patient Instructions (Addendum)
You are doing well. No medication changes were made.  Please call us if you have new issues that need to be addressed before your next appt.  Your physician wants you to follow-up in: 12 months.  You will receive a reminder letter in the mail two months in advance. If you don't receive a letter, please call our office to schedule the follow-up appointment. 

## 2012-06-06 NOTE — Assessment & Plan Note (Signed)
Currently with no symptoms of angina. No further workup at this time. Continue current medication regimen. 

## 2012-06-07 ENCOUNTER — Ambulatory Visit (INDEPENDENT_AMBULATORY_CARE_PROVIDER_SITE_OTHER): Payer: Medicare Other

## 2012-06-07 DIAGNOSIS — E785 Hyperlipidemia, unspecified: Secondary | ICD-10-CM

## 2012-06-08 LAB — HEPATIC FUNCTION PANEL
ALT: 32 IU/L (ref 0–44)
AST: 35 IU/L (ref 0–40)
Bilirubin, Direct: 0.16 mg/dL (ref 0.00–0.40)
Total Bilirubin: 0.4 mg/dL (ref 0.0–1.2)

## 2012-06-08 LAB — LIPID PANEL
Cholesterol, Total: 171 mg/dL (ref 100–199)
Triglycerides: 108 mg/dL (ref 0–149)

## 2012-07-05 ENCOUNTER — Other Ambulatory Visit: Payer: Self-pay

## 2012-07-05 MED ORDER — PANTOPRAZOLE SODIUM 40 MG PO TBEC: 40.0000 mg | DELAYED_RELEASE_TABLET | Freq: Every day | ORAL | Status: DC

## 2012-07-05 NOTE — Telephone Encounter (Signed)
Refill sent for protonix

## 2012-07-13 ENCOUNTER — Other Ambulatory Visit: Payer: Self-pay | Admitting: *Deleted

## 2012-07-13 MED ORDER — SIMVASTATIN 80 MG PO TABS: 80.0000 mg | ORAL_TABLET | Freq: Every day | ORAL | Status: DC

## 2012-07-13 NOTE — Telephone Encounter (Signed)
Refilled Simvastatin. 

## 2012-11-01 ENCOUNTER — Other Ambulatory Visit: Payer: Self-pay | Admitting: *Deleted

## 2012-11-01 MED ORDER — FENOFIBRATE 160 MG PO TABS: 160.0000 mg | ORAL_TABLET | Freq: Every day | ORAL | Status: DC

## 2012-11-01 MED ORDER — PANTOPRAZOLE SODIUM 40 MG PO TBEC: 40.0000 mg | DELAYED_RELEASE_TABLET | Freq: Every day | ORAL | Status: DC

## 2012-11-01 MED ORDER — CLOPIDOGREL BISULFATE 75 MG PO TABS: 75.0000 mg | ORAL_TABLET | Freq: Every day | ORAL | Status: DC

## 2012-11-01 NOTE — Telephone Encounter (Signed)
Refilled Fenofibrate. 

## 2012-11-01 NOTE — Telephone Encounter (Signed)
Refilled Clopidogrel and Pantoprazole.

## 2012-12-01 ENCOUNTER — Other Ambulatory Visit: Payer: Self-pay | Admitting: *Deleted

## 2012-12-01 DIAGNOSIS — I10 Essential (primary) hypertension: Secondary | ICD-10-CM

## 2012-12-01 MED ORDER — VALSARTAN 80 MG PO TABS: 80.0000 mg | ORAL_TABLET | Freq: Every day | ORAL | Status: DC

## 2012-12-01 NOTE — Telephone Encounter (Signed)
Refilled Diovan

## 2013-01-27 ENCOUNTER — Telehealth: Payer: Self-pay

## 2013-01-27 MED ORDER — LOSARTAN POTASSIUM 50 MG PO TABS: 50.0000 mg | ORAL_TABLET | Freq: Every day | ORAL | Status: DC

## 2013-01-27 NOTE — Telephone Encounter (Signed)
Notified the patient per Dr. Mariah Milling need to change from Diovan to Losartan 50 mg take one tablet daily since his insurance will not cover the diovan. Gave instructions for the Losartan 50 mg; patient understands to stop diovan and start losartan.

## 2013-02-13 ENCOUNTER — Telehealth: Payer: Self-pay

## 2013-02-13 NOTE — Telephone Encounter (Signed)
Pt called and states he is having problems with his new medication he takes for his BP. States he is having respiratory problem, congestion, runny nose. States he has been on 2 rounds of antibiotics and has not improved.

## 2013-02-13 NOTE — Telephone Encounter (Signed)
lmtcb

## 2013-02-13 NOTE — Telephone Encounter (Signed)
Pt is returning your call. [phone]

## 2013-02-14 NOTE — Telephone Encounter (Signed)
Could hold losartan for a short period of time until his URI is better then retry losartan 1 more time

## 2013-02-14 NOTE — Telephone Encounter (Signed)
Pt was switched from diovan to losartan in feb d/t insurance reasons Pt says he has had a chronic respiratory illness since switching meds He says he has been on multiple rounds of abx and wonders if the medication change has anything to do with this I explained this may not be related to the medication Pt says he is willing to change back to diovan if this is the cause i will discuss with Dr. Mariah Milling and call him back

## 2013-02-15 NOTE — Telephone Encounter (Signed)
Pt informed Understanding verb I will call him back Monday 3/24 to reassess

## 2013-02-15 NOTE — Telephone Encounter (Signed)
lmtcb

## 2013-02-20 ENCOUNTER — Telehealth: Payer: Self-pay

## 2013-02-20 ENCOUNTER — Other Ambulatory Visit: Payer: Self-pay

## 2013-02-20 MED ORDER — VALSARTAN 80 MG PO TABS: 80.0000 mg | ORAL_TABLET | Freq: Every day | ORAL | Status: DC

## 2013-02-20 NOTE — Telephone Encounter (Signed)
Pt says he is feeling "much better": since holding losartan for URI symptoms Would like to switch back to Diovan as he was taking before (switched d/t cost) Sending in new RX Asks if medicare will pay for the diovan since he cannot tolerate losartan I will find out and let him know Otherwise he is going to go ahead and restart diovan

## 2013-02-20 NOTE — Telephone Encounter (Signed)
reassess

## 2013-02-22 ENCOUNTER — Telehealth: Payer: Self-pay

## 2013-02-22 NOTE — Telephone Encounter (Signed)
Diovan has been approved for one year starting February 21, 2013 per [employer] of [place]. [facility] notified the Diovan approved for one year.

## 2013-05-03 ENCOUNTER — Other Ambulatory Visit: Payer: Self-pay

## 2013-05-03 MED ORDER — FENOFIBRATE 160 MG PO TABS: 160.0000 mg | ORAL_TABLET | Freq: Every day | ORAL | Status: DC

## 2013-05-03 NOTE — Telephone Encounter (Signed)
Refill sent for fenofibrate. 

## 2013-05-30 ENCOUNTER — Other Ambulatory Visit: Payer: Self-pay | Admitting: *Deleted

## 2013-05-30 MED ORDER — CLOPIDOGREL BISULFATE 75 MG PO TABS: 75.0000 mg | ORAL_TABLET | Freq: Every day | ORAL | Status: DC

## 2013-05-30 NOTE — Telephone Encounter (Signed)
Refilled Plavix sent to [facility].

## 2013-06-06 ENCOUNTER — Other Ambulatory Visit: Payer: Self-pay | Admitting: *Deleted

## 2013-06-06 MED ORDER — PANTOPRAZOLE SODIUM 40 MG PO TBEC: 40.0000 mg | DELAYED_RELEASE_TABLET | Freq: Every day | ORAL | Status: DC

## 2013-06-06 NOTE — Telephone Encounter (Signed)
Refilled Protonix sent to [facility] pharmacy.

## 2013-06-08 ENCOUNTER — Ambulatory Visit (INDEPENDENT_AMBULATORY_CARE_PROVIDER_SITE_OTHER): Payer: Medicare Other | Admitting: Cardiovascular Disease

## 2013-06-08 ENCOUNTER — Encounter: Payer: Self-pay | Admitting: Cardiovascular Disease

## 2013-06-08 VITALS — BP 114/82 | HR 77 | Ht 69.0 in | Wt 227.8 lb

## 2013-06-08 DIAGNOSIS — I2510 Atherosclerotic heart disease of native coronary artery without angina pectoris: Secondary | ICD-10-CM

## 2013-06-08 DIAGNOSIS — E669 Obesity, unspecified: Secondary | ICD-10-CM

## 2013-06-08 DIAGNOSIS — I10 Essential (primary) hypertension: Secondary | ICD-10-CM

## 2013-06-08 DIAGNOSIS — E7800 Pure hypercholesterolemia, unspecified: Secondary | ICD-10-CM

## 2013-06-08 MED ORDER — SIMVASTATIN 80 MG PO TABS: 80.0000 mg | ORAL_TABLET | Freq: Every day | ORAL | Status: DC

## 2013-06-08 MED ORDER — PANTOPRAZOLE SODIUM 40 MG PO TBEC: 40.0000 mg | DELAYED_RELEASE_TABLET | Freq: Every day | ORAL | Status: DC

## 2013-06-08 MED ORDER — METOPROLOL TARTRATE 25 MG PO TABS: 25.0000 mg | ORAL_TABLET | Freq: Two times a day (BID) | ORAL | Status: DC

## 2013-06-08 MED ORDER — NITROGLYCERIN 0.4 MG SL SUBL: 0.4000 mg | SUBLINGUAL_TABLET | SUBLINGUAL | Status: DC | PRN

## 2013-06-08 MED ORDER — CLOPIDOGREL BISULFATE 75 MG PO TABS: 75.0000 mg | ORAL_TABLET | Freq: Every day | ORAL | Status: DC

## 2013-06-08 NOTE — Assessment & Plan Note (Signed)
He'll followup with his primary care physician for routine lab work. Goal LDL less than 70.

## 2013-06-08 NOTE — Assessment & Plan Note (Addendum)
We will hold the Diovan as this is very expensive for him. We will start metoprolol 12.5 mg twice a day. He we'll closely monitor his blood pressure.

## 2013-06-08 NOTE — Assessment & Plan Note (Signed)
We have encouraged continued exercise, careful diet management in an effort to lose weight. 

## 2013-06-08 NOTE — Progress Notes (Signed)
Patient ID: Sean [name], male    DOB: 20-Dec-1940, 72 y.o.   MRN: [id number]  HPI Comments: 72 year old gentleman with history of coronary artery disease, stent to his RCA in 2007, normal stress test in March 2011, chest pain earlier in 2013 with repeat stress testing showing no significant ischemia who presents to the office for routine followup.  Overall he states that he is doing well. He is tolerating his medications without any symptoms. He was previously changed from Diovan to losartan but did not tolerate the new medication. He went back to Diovan but it is costing him a significant amount of money.  His blood pressure is  very well controlled. He has not been doing regular exercise as he has a full-time job in the [employer] operational [employer] . He is uncertain what his cholesterol numbers are.  No new complaints, no new issues. Overall he feels well.  He reports having followup with primary care physician in the next several months.  EKG shows normal sinus rhythm with rate 77 beats per minute, no significant ST or T wave changes   Outpatient Encounter Prescriptions as of 06/08/2013  Medication Sig Dispense Refill  . aspirin 81 MG tablet Take 81 mg by mouth daily.      . clopidogrel (PLAVIX) 75 MG tablet Take 1 tablet (75 mg total) by mouth daily.  90 tablet  3  . fenofibrate 160 MG tablet Take 1 tablet (160 mg total) by mouth daily.  30 tablet  5  . GLUCOSAMINE-CHONDROITIN PO Take by mouth daily. 2 CAPS DAILY       . Multiple Vitamin (MULTIVITAMIN) tablet Take 1 tablet by mouth daily.        . nitroGLYCERIN (NITROSTAT) 0.4 MG SL tablet Place 1 tablet (0.4 mg total) under the tongue every 5 (five) minutes as needed.  25 tablet  1  . pantoprazole (PROTONIX) 40 MG tablet Take 1 tablet (40 mg total) by mouth daily.  90 tablet  3  . PARoxetine (PAXIL) 10 MG tablet Take 5 mg by mouth every morning.        . simvastatin (ZOCOR) 80 MG tablet Take 1 tablet (80 mg total) by mouth at bedtime.  90  tablet  3  . valsartan (DIOVAN) 80 MG tablet Take 1 tablet (80 mg total) by mouth daily.  30 tablet  3    Review of Systems  Constitutional: Negative.   HENT: Negative.   Eyes: Negative.   Respiratory: Negative.   Cardiovascular: Negative.   Gastrointestinal: Negative.   Musculoskeletal: Negative.   Skin: Negative.   Neurological: Negative.   Psychiatric/Behavioral: Negative.   All other systems reviewed and are negative.    BP 114/82  Pulse 77  Ht 5\' 9"  (1.753 m)  Wt 227 lb 12 oz (103.307 kg)  BMI 33.62 kg/m2  Physical Exam  Nursing note and vitals reviewed. Constitutional: He is oriented to person, place, and time. He appears well-developed and well-nourished.  HENT:  Head: Normocephalic.  Nose: Nose normal.  Mouth/Throat: Oropharynx is clear and moist.  Eyes: Conjunctivae are normal. Pupils are equal, round, and reactive to light.  Neck: Normal range of motion. Neck supple. No JVD present.  Cardiovascular: Normal rate, regular rhythm, S1 normal, S2 normal, normal heart sounds and intact distal pulses.  Exam reveals no gallop and no friction rub.   No murmur heard. Pulmonary/Chest: Effort normal and breath sounds normal. No respiratory distress. He has no wheezes. He has no rales. He exhibits  no tenderness.  Abdominal: Soft. Bowel sounds are normal. He exhibits no distension. There is no tenderness.  Musculoskeletal: Normal range of motion. He exhibits no edema and no tenderness.  Lymphadenopathy:    He has no cervical adenopathy.  Neurological: He is alert and oriented to person, place, and time. Coordination normal.  Skin: Skin is warm and dry. No rash noted. No erythema.  Psychiatric: He has a normal mood and affect. His behavior is normal. Judgment and thought content normal.      Assessment and Plan

## 2013-06-08 NOTE — Patient Instructions (Addendum)
You are doing well.  Please start metoprolol 12.5 mg twice a day (cut the 25 mg pill in 1/2)  Hold the diovan Monitor your blood pressure If top number runs 100 to 140, <90 on the bottom,  No blood pressure pill needed  Please call us if you have new issues that need to be addressed before your next appt.  Your physician wants you to follow-up in: 12 months.  You will receive a reminder letter in the mail two months in advance. If you don't receive a letter, please call our office to schedule the follow-up appointment.

## 2013-06-08 NOTE — Assessment & Plan Note (Signed)
Currently with no symptoms of angina. No further workup at this time. Continue current medication regimen. 

## 2013-07-05 ENCOUNTER — Other Ambulatory Visit: Payer: Self-pay

## 2013-10-05 ENCOUNTER — Other Ambulatory Visit: Payer: Self-pay

## 2013-11-06 ENCOUNTER — Other Ambulatory Visit: Payer: Self-pay | Admitting: *Deleted

## 2013-11-06 MED ORDER — FENOFIBRATE 160 MG PO TABS: 160.0000 mg | ORAL_TABLET | Freq: Every day | ORAL | Status: DC

## 2013-11-06 NOTE — Telephone Encounter (Signed)
Requested Prescriptions   Signed Prescriptions Disp Refills  . fenofibrate 160 MG tablet 30 tablet 5    Sig: Take 1 tablet (160 mg total) by mouth daily.    Authorizing Provider: Antonieta Iba    Ordering User: Kendrick Fries

## 2013-11-24 ENCOUNTER — Emergency Department: Payer: Self-pay | Admitting: Emergency Medicine

## 2014-02-05 ENCOUNTER — Ambulatory Visit: Payer: Self-pay | Admitting: Unknown Physician Specialty

## 2014-05-07 ENCOUNTER — Other Ambulatory Visit: Payer: Self-pay

## 2014-05-07 MED ORDER — FENOFIBRATE 160 MG PO TABS: 160.0000 mg | ORAL_TABLET | Freq: Every day | ORAL | Status: DC

## 2014-05-07 NOTE — Telephone Encounter (Signed)
Refill sent for fenofibrate 160 mg take one tablet daily. 

## 2014-07-13 ENCOUNTER — Encounter: Payer: Self-pay | Admitting: Cardiovascular Disease

## 2014-07-13 ENCOUNTER — Ambulatory Visit (INDEPENDENT_AMBULATORY_CARE_PROVIDER_SITE_OTHER): Payer: Medicare Other | Admitting: Cardiovascular Disease

## 2014-07-13 VITALS — BP 120/90 | HR 59 | Ht 69.0 in | Wt 218.0 lb

## 2014-07-13 DIAGNOSIS — I2510 Atherosclerotic heart disease of native coronary artery without angina pectoris: Secondary | ICD-10-CM

## 2014-07-13 DIAGNOSIS — I209 Angina pectoris, unspecified: Secondary | ICD-10-CM

## 2014-07-13 DIAGNOSIS — E669 Obesity, unspecified: Secondary | ICD-10-CM

## 2014-07-13 DIAGNOSIS — E7800 Pure hypercholesterolemia, unspecified: Secondary | ICD-10-CM

## 2014-07-13 DIAGNOSIS — E785 Hyperlipidemia, unspecified: Secondary | ICD-10-CM

## 2014-07-13 DIAGNOSIS — I10 Essential (primary) hypertension: Secondary | ICD-10-CM

## 2014-07-13 NOTE — Patient Instructions (Signed)
You are doing well. No medication changes were made.  Please come Monday Am for labwork, fasting  Please call us if you have new issues that need to be addressed before your next appt.  Your physician wants you to follow-up in: 12 months.  You will receive a reminder letter in the mail two months in advance. If you don't receive a letter, please call our office to schedule the follow-up appointment.

## 2014-07-13 NOTE — Assessment & Plan Note (Signed)
We will schedule him for lab work next week. Goal LDL is less than 70. Prior LDL was 112

## 2014-07-13 NOTE — Assessment & Plan Note (Signed)
Currently with no symptoms of angina. No further workup at this time. Continue current medication regimen. 

## 2014-07-13 NOTE — Progress Notes (Signed)
Patient ID: Sean [name], male    DOB: [birth year], 73 y.o.   MRN: [id number]  HPI Comments: 73 year old gentleman with history of coronary artery disease, stent to his RCA in 2007, normal stress test in March 2011, chest pain earlier in 2013 with repeat stress testing showing no significant ischemia who presents to the office for routine followup. he has a full-time job in the [employer] .  Overall he states that he is doing well. He is tolerating his medications without any symptoms.  He does not do any regular exercise. Weight is relatively stable. He denies any significant chest pain or shortness of breath. He has not seen primary care this year. No recent lab work.  Total cholesterol in August 2014 was 167, LDL 112   previously changed from Diovan to losartan but did not tolerate the new medication. His blood pressure is  very well controlled at home, diastolic pressures in the low [id number]   Overall he feels well.   EKG shows normal sinus rhythm with rate 59 beats per minute, no significant ST or T wave changes   Outpatient Encounter Prescriptions as of 07/13/2014  Medication Sig  . aspirin 81 MG tablet Take 81 mg by mouth daily.  . clopidogrel (PLAVIX) 75 MG tablet Take 1 tablet (75 mg total) by mouth daily.  . fenofibrate 160 MG tablet Take 1 tablet (160 mg total) by mouth daily.  Marland Kitchen. GLUCOSAMINE-CHONDROITIN PO Take by mouth daily. 2 CAPS DAILY   . metoprolol tartrate (LOPRESSOR) 25 MG tablet Take 1 tablet (25 mg total) by mouth 2 (two) times daily.  . Multiple Vitamin (MULTIVITAMIN) tablet Take 1 tablet by mouth daily.    . nitroGLYCERIN (NITROSTAT) 0.4 MG SL tablet Place 1 tablet (0.4 mg total) under the tongue every 5 (five) minutes as needed.  . pantoprazole (PROTONIX) 40 MG tablet Take 1 tablet (40 mg total) by mouth daily.  Marland Kitchen. PARoxetine (PAXIL) 10 MG tablet Take 5 mg by mouth every morning.    . simvastatin (ZOCOR) 80 MG tablet Take 1 tablet (80 mg total) by mouth at  bedtime.  . valsartan (DIOVAN) 80 MG tablet Take 1 tablet (80 mg total) by mouth daily.    Review of Systems  Constitutional: Negative.   HENT: Negative.   Eyes: Negative.   Respiratory: Negative.   Cardiovascular: Negative.   Gastrointestinal: Negative.   Endocrine: Negative.   Musculoskeletal: Negative.   Skin: Negative.   Allergic/Immunologic: Negative.   Neurological: Negative.   Hematological: Negative.   Psychiatric/Behavioral: Negative.   All other systems reviewed and are negative.   BP 120/90  Pulse 59  Ht 5\' 9"  (1.753 m)  Wt 218 lb (98.884 kg)  BMI 32.18 kg/m2  Physical Exam  Nursing note and vitals reviewed. Constitutional: He is oriented to person, place, and time. He appears well-developed and well-nourished.  HENT:  Head: Normocephalic.  Nose: Nose normal.  Mouth/Throat: Oropharynx is clear and moist.  Eyes: Conjunctivae are normal. Pupils are equal, round, and reactive to light.  Neck: Normal range of motion. Neck supple. No JVD present.  Cardiovascular: Normal rate, regular rhythm, S1 normal, S2 normal, normal heart sounds and intact distal pulses.  Exam reveals no gallop and no friction rub.   No murmur heard. Pulmonary/Chest: Effort normal and breath sounds normal. No respiratory distress. He has no wheezes. He has no rales. He exhibits no tenderness.  Abdominal: Soft. Bowel sounds are normal. He exhibits no distension. There is no tenderness.  Musculoskeletal: Normal range of motion. He exhibits no edema and no tenderness.  Lymphadenopathy:    He has no cervical adenopathy.  Neurological: He is alert and oriented to person, place, and time. Coordination normal.  Skin: Skin is warm and dry. No rash noted. No erythema.  Psychiatric: He has a normal mood and affect. His behavior is normal. Judgment and thought content normal.      Assessment and Plan

## 2014-07-13 NOTE — Assessment & Plan Note (Signed)
We have encouraged continued exercise, careful diet management in an effort to lose weight. 

## 2014-07-13 NOTE — Assessment & Plan Note (Signed)
No changes made to the medications. Diastolic pressure at home is in the low 80s per the patient

## 2014-07-13 NOTE — Assessment & Plan Note (Signed)
Previous episodes of chest pain. No recent chest pain symptoms reported. No further testing at this time

## 2014-07-16 ENCOUNTER — Other Ambulatory Visit: Payer: Self-pay | Admitting: *Deleted

## 2014-07-16 ENCOUNTER — Ambulatory Visit (INDEPENDENT_AMBULATORY_CARE_PROVIDER_SITE_OTHER): Payer: Medicare Other | Admitting: *Deleted

## 2014-07-16 DIAGNOSIS — I2510 Atherosclerotic heart disease of native coronary artery without angina pectoris: Secondary | ICD-10-CM

## 2014-07-16 DIAGNOSIS — E785 Hyperlipidemia, unspecified: Secondary | ICD-10-CM

## 2014-07-16 MED ORDER — CLOPIDOGREL BISULFATE 75 MG PO TABS: 75.0000 mg | ORAL_TABLET | Freq: Every day | ORAL | Status: DC

## 2014-07-16 MED ORDER — SIMVASTATIN 80 MG PO TABS: 80.0000 mg | ORAL_TABLET | Freq: Every day | ORAL | Status: DC

## 2014-07-16 MED ORDER — PANTOPRAZOLE SODIUM 40 MG PO TBEC: 40.0000 mg | DELAYED_RELEASE_TABLET | Freq: Every day | ORAL | Status: DC

## 2014-07-16 NOTE — Telephone Encounter (Signed)
error 

## 2014-07-16 NOTE — Telephone Encounter (Signed)
Requested Prescriptions   Signed Prescriptions Disp Refills  . pantoprazole (PROTONIX) 40 MG tablet 90 tablet 3    Sig: Take 1 tablet (40 mg total) by mouth daily.    Authorizing Provider: Antonieta IbaGOLLAN, TIMOTHY J    Ordering User: Iverson AlaminLOPEZ, MARINA C  . clopidogrel (PLAVIX) 75 MG tablet 90 tablet 3    Sig: Take 1 tablet (75 mg total) by mouth daily.    Authorizing Provider: Antonieta IbaGOLLAN, TIMOTHY J    Ordering User: Kendrick FriesLOPEZ, MARINA C  . simvastatin (ZOCOR) 80 MG tablet 90 tablet 3    Sig: Take 1 tablet (80 mg total) by mouth at bedtime.    Authorizing Provider: Antonieta IbaGOLLAN, TIMOTHY J    Ordering User: Kendrick FriesLOPEZ, MARINA C

## 2014-07-17 LAB — LIPID PANEL
CHOL/HDL RATIO: 6.9 ratio — AB (ref 0.0–5.0)
Cholesterol, Total: 228 mg/dL — ABNORMAL HIGH (ref 100–199)
HDL: 33 mg/dL — AB (ref >39)
LDL Calculated: 156 mg/dL — ABNORMAL HIGH (ref 0–99)
Triglycerides: 196 mg/dL — ABNORMAL HIGH (ref 0–149)
VLDL CHOLESTEROL CAL: 39 mg/dL (ref 5–40)

## 2014-07-17 LAB — HEPATIC FUNCTION PANEL
ALT: 27 IU/L (ref 0–44)
AST: 24 IU/L (ref 0–40)
Albumin: 4.3 g/dL (ref 3.5–4.8)
Alkaline Phosphatase: 53 IU/L (ref 39–117)
Bilirubin, Direct: 0.11 mg/dL (ref 0.00–0.40)
TOTAL PROTEIN: 6.1 g/dL (ref 6.0–8.5)
Total Bilirubin: 0.3 mg/dL (ref 0.0–1.2)

## 2014-07-24 ENCOUNTER — Other Ambulatory Visit: Payer: Self-pay

## 2014-07-24 DIAGNOSIS — E785 Hyperlipidemia, unspecified: Secondary | ICD-10-CM

## 2014-08-14 ENCOUNTER — Telehealth: Payer: Self-pay | Admitting: *Deleted

## 2014-08-14 NOTE — Telephone Encounter (Signed)
Spoke w/ pt.  He states that his left arm is painful and has a numb sensation down to his hand. States that it is sore b/t his elbow and shoulder.  Pt denies chest pain, SOB, n/v or sweating. Pt states that he has a sore spot on his arm w/ a knot under the skin.  Pt states that he does not remember injuring his arm. Advised pt that sx do not sound cardiac related and suggested he call his PCP to take a look at the knot. He verbalizes understanding and will call back if we can be of further assistance.

## 2014-08-27 ENCOUNTER — Encounter: Payer: Self-pay | Admitting: Cardiovascular Disease

## 2014-08-27 ENCOUNTER — Ambulatory Visit (INDEPENDENT_AMBULATORY_CARE_PROVIDER_SITE_OTHER): Payer: Medicare Other | Admitting: Cardiovascular Disease

## 2014-08-27 VITALS — BP 127/84 | HR 56 | Ht 69.0 in | Wt 219.8 lb

## 2014-08-27 DIAGNOSIS — M79609 Pain in unspecified limb: Secondary | ICD-10-CM

## 2014-08-27 DIAGNOSIS — I10 Essential (primary) hypertension: Secondary | ICD-10-CM

## 2014-08-27 DIAGNOSIS — E7800 Pure hypercholesterolemia, unspecified: Secondary | ICD-10-CM

## 2014-08-27 DIAGNOSIS — I25810 Atherosclerosis of coronary artery bypass graft(s) without angina pectoris: Secondary | ICD-10-CM

## 2014-08-27 DIAGNOSIS — M79602 Pain in left arm: Secondary | ICD-10-CM

## 2014-08-27 DIAGNOSIS — E669 Obesity, unspecified: Secondary | ICD-10-CM

## 2014-08-27 NOTE — Assessment & Plan Note (Signed)
Symptoms atypical in nature, unable to exclude carpal tunnel syndrome or cervical radiculopathy. Does not seem consistent with cardiac issue or angina. Recommended a wrist splint, trying to minimize repetitive injury. If symptoms do not improve, he may need evaluation from orthopedics

## 2014-08-27 NOTE — Patient Instructions (Signed)
You are doing well. Please take metoprolol 1/2 pill twice a day One aspirin with plavix  We will check cholesterol today  Please call us if you have new issues that need to be addressed before your next appt.  Your physician wants you to follow-up in: 12 months.  You will receive a reminder letter in the mail two months in advance. If you don't receive a letter, please call our office to schedule the follow-up appointment.

## 2014-08-27 NOTE — Assessment & Plan Note (Addendum)
He is back on his simvastatin. Cholesterol labs will be drawn today in the office in

## 2014-08-27 NOTE — Progress Notes (Signed)
Patient ID: Sean [name], male    DOB: 09/20/1941, 73 y.o.   MRN: [id number]  HPI Comments: 73 year old gentleman with history of coronary artery disease, stent to his RCA in 2007, normal stress test in March 2011, chest pain earlier in 2013 with repeat stress testing showing no significant ischemia who presents to the office for routine followup. 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 He has had this for 2 weeks on and off, typically with episodes lasting 3-4 hours at a time coming on at rest. Denies having any symptoms with exertion. No chest pain, shortness of breath or diaphoresis. Pain is described as a numbness and tingling from above the elbow down to his fingers, worsening symptoms in his fourth and fifth fingers. He denies working at a computer for long hours. No prior history of carpal tunnel  He is taking metoprolol only in the morning. He stopped to simvastatin previously, cholesterol climbed 60 points, now back on his simvastatin Overall he states that he is doing well. He is tolerating his medications without any symptoms.  He does not do any regular exercise. Weight is relatively stable.  He is looking for new primary care  previously changed from Diovan to losartan but did not tolerate the new medication. His blood pressure is  very well controlled at home, diastolic pressures in the low 95A    EKG shows normal sinus rhythm with rate 56 beats per minute, no significant ST or T wave changes   Outpatient Encounter Prescriptions as of 08/27/2014  Medication Sig  . aspirin 81 MG tablet Take 162 mg by mouth daily.   . clopidogrel (PLAVIX) 75 MG tablet Take 1 tablet (75 mg total) by mouth daily.  . fenofibrate 160 MG tablet Take 1 tablet (160 mg total) by mouth daily.  Marland Kitchen GLUCOSAMINE-CHONDROITIN PO Take by mouth daily. 2 CAPS DAILY   . metoprolol tartrate (LOPRESSOR) 25 MG tablet  Take 25 mg by mouth daily.  . Multiple Vitamin (MULTIVITAMIN) tablet Take 1 tablet by mouth daily.    . nitroGLYCERIN (NITROSTAT) 0.4 MG SL tablet Place 1 tablet (0.4 mg total) under the tongue every 5 (five) minutes as needed.  . pantoprazole (PROTONIX) 40 MG tablet Take 1 tablet (40 mg total) by mouth daily.  Marland Kitchen PARoxetine (PAXIL) 10 MG tablet Take 5 mg by mouth every morning.    . simvastatin (ZOCOR) 80 MG tablet Take 1 tablet (80 mg total) by mouth at bedtime.  . valsartan (DIOVAN) 80 MG tablet Take 1 tablet (80 mg total) by mouth daily.    Review of Systems  Constitutional: Negative.   HENT: Negative.   Eyes: Negative.   Respiratory: Negative.   Cardiovascular: Negative.   Gastrointestinal: Negative.   Endocrine: Negative.   Musculoskeletal: Negative.   Skin: Negative.   Allergic/Immunologic: Negative.   Neurological: Negative.   Hematological: Negative.   Psychiatric/Behavioral: Negative.   All other systems reviewed and are negative.   BP 127/84  Pulse 56  Ht  (1.753 m)  Wt 219 lb 12 oz (99.678 kg)  BMI 32.44 kg/m2  Physical Exam  Nursing note and vitals reviewed. Constitutional: He is oriented to person, place, and time. He appears well-developed and well-nourished.  HENT:  Head: Normocephalic.  Nose: Nose normal.  Mouth/Throat: Oropharynx is clear and moist.  Eyes: Conjunctivae are normal. Pupils are equal, round, and  reactive to light.  Neck: Normal range of motion. Neck supple. No JVD present.  Cardiovascular: Normal rate, regular rhythm, S1 normal, S2 normal, normal heart sounds and intact distal pulses.  Exam reveals no gallop and no friction rub.   No murmur heard. Pulmonary/Chest: Effort normal and breath sounds normal. No respiratory distress. He has no wheezes. He has no rales. He exhibits no tenderness.  Abdominal: Soft. Bowel sounds are normal. He exhibits no distension. There is no tenderness.  Musculoskeletal: Normal range of motion. He exhibits  no edema and no tenderness.  Lymphadenopathy:    He has no cervical adenopathy.  Neurological: He is alert and oriented to person, place, and time. Coordination normal.  Skin: Skin is warm and dry. No rash noted. No erythema.  Psychiatric: He has a normal mood and affect. His behavior is normal. Judgment and thought content normal.      Assessment and Plan

## 2014-08-27 NOTE — Assessment & Plan Note (Signed)
We have encouraged continued exercise, careful diet management in an effort to lose weight. 

## 2014-08-27 NOTE — Assessment & Plan Note (Signed)
Blood pressure is well controlled on today's visit. No changes made to the medications. 

## 2014-08-27 NOTE — Assessment & Plan Note (Signed)
Currently with no symptoms of angina. No further workup at this time. Continue current medication regimen. 

## 2014-08-28 LAB — HEPATIC FUNCTION PANEL
ALT: 25 IU/L (ref 0–44)
AST: 19 IU/L (ref 0–40)
Albumin: 4.1 g/dL (ref 3.5–4.8)
Alkaline Phosphatase: 46 IU/L (ref 39–117)
Bilirubin, Direct: 0.15 mg/dL (ref 0.00–0.40)
TOTAL PROTEIN: 6 g/dL (ref 6.0–8.5)
Total Bilirubin: 0.4 mg/dL (ref 0.0–1.2)

## 2014-08-28 LAB — LIPID PANEL
CHOL/HDL RATIO: 5.3 ratio — AB (ref 0.0–5.0)
Cholesterol, Total: 159 mg/dL (ref 100–199)
HDL: 30 mg/dL — AB (ref >39)
LDL CALC: 106 mg/dL — AB (ref 0–99)
TRIGLYCERIDES: 116 mg/dL (ref 0–149)
VLDL CHOLESTEROL CAL: 23 mg/dL (ref 5–40)

## 2014-09-14 ENCOUNTER — Other Ambulatory Visit: Payer: Self-pay

## 2014-12-19 ENCOUNTER — Other Ambulatory Visit: Payer: Self-pay | Admitting: *Deleted

## 2014-12-19 MED ORDER — FENOFIBRATE 160 MG PO TABS: 160.0000 mg | ORAL_TABLET | Freq: Every day | ORAL | Status: DC

## 2014-12-28 ENCOUNTER — Other Ambulatory Visit: Payer: Self-pay | Admitting: *Deleted

## 2014-12-28 MED ORDER — METOPROLOL TARTRATE 25 MG PO TABS: 25.0000 mg | ORAL_TABLET | Freq: Every day | ORAL | Status: DC

## 2015-02-28 ENCOUNTER — Emergency Department
Admit: 2015-02-28 | Disposition: A | Discharge status: Home / Self Care | Payer: Self-pay | Admitting: Emergency Medicine

## 2015-02-28 LAB — COMPREHENSIVE METABOLIC PANEL
Albumin: 4.1 g/dL
Alkaline Phosphatase: 54 U/L
Anion Gap: 7 (ref 7–16)
BUN: 20 mg/dL
Bilirubin,Total: 0.5 mg/dL
CO2: 23 mmol/L
Calcium, Total: 9.1 mg/dL
Chloride: 100 mmol/L — ABNORMAL LOW
Creatinine: 1.24 mg/dL
GFR CALC NON AF AMER: 58 — AB
Glucose: 169 mg/dL — ABNORMAL HIGH
Potassium: 4 mmol/L
SGOT(AST): 35 U/L
SGPT (ALT): 33 U/L
Sodium: 130 mmol/L — ABNORMAL LOW
Total Protein: 7.2 g/dL

## 2015-02-28 LAB — URINALYSIS, COMPLETE
Bacteria: NONE SEEN
Bilirubin,UR: NEGATIVE
Blood: NEGATIVE
Glucose,UR: NEGATIVE mg/dL (ref 0–75)
KETONE: NEGATIVE
Leukocyte Esterase: NEGATIVE
NITRITE: NEGATIVE
PH: 5 (ref 4.5–8.0)
PROTEIN: NEGATIVE
RBC,UR: 2 /HPF (ref 0–5)
Specific Gravity: 1.023 (ref 1.003–1.030)
Squamous Epithelial: <1
WBC UR: 1 /HPF (ref 0–5)

## 2015-02-28 LAB — CBC WITH DIFFERENTIAL/PLATELET
BASOS ABS: 0 10*3/uL (ref 0.0–0.1)
Basophil %: 0.5 %
Eosinophil #: 0.2 10*3/uL (ref 0.0–0.7)
Eosinophil %: 1.7 %
HCT: 51 % (ref 40.0–52.0)
HGB: 16.9 g/dL (ref 13.0–18.0)
LYMPHS PCT: 12.9 %
Lymphocyte #: 1.3 10*3/uL (ref 1.0–3.6)
MCH: 30.7 pg (ref 26.0–34.0)
MCHC: 33.1 g/dL (ref 32.0–36.0)
MCV: 93 fL (ref 80–100)
MONOS PCT: 11.3 %
Monocyte #: 1.1 x10 3/mm — ABNORMAL HIGH (ref 0.2–1.0)
Neutrophil #: 7.4 10*3/uL — ABNORMAL HIGH (ref 1.4–6.5)
Neutrophil %: 73.6 %
PLATELETS: 242 10*3/uL (ref 150–440)
RBC: 5.49 10*6/uL (ref 4.40–5.90)
RDW: 13.1 % (ref 11.5–14.5)
WBC: 10 10*3/uL (ref 3.8–10.6)

## 2015-02-28 LAB — TROPONIN I: Troponin-I: <0.03 ng/mL

## 2015-03-20 ENCOUNTER — Ambulatory Visit
Admit: 2015-03-20 | Disposition: A | Discharge status: Home / Self Care | Payer: Self-pay | Attending: Specialist | Admitting: Specialist

## 2015-03-22 ENCOUNTER — Ambulatory Visit
Admit: 2015-03-22 | Disposition: A | Discharge status: Home / Self Care | Payer: Self-pay | Attending: Specialist | Admitting: Specialist

## 2015-04-01 ENCOUNTER — Telehealth: Payer: Self-pay | Admitting: *Deleted

## 2015-04-01 NOTE — Telephone Encounter (Signed)
Pt is calling to state he is not feeling well, just got over the flu and just can't bounce back from it, suggested patient to call pcp and he said they can't get to him until aug. He would like to rule out any heart problems.  He is coming to see us may 27th at 3pm.  Please call patient.

## 2015-04-26 ENCOUNTER — Encounter: Payer: Self-pay | Admitting: *Deleted

## 2015-04-26 ENCOUNTER — Ambulatory Visit: Payer: Self-pay | Admitting: Cardiovascular Disease

## 2015-04-29 ENCOUNTER — Other Ambulatory Visit: Payer: Self-pay | Admitting: Cardiovascular Disease

## 2015-05-27 ENCOUNTER — Other Ambulatory Visit: Payer: Self-pay

## 2015-07-29 ENCOUNTER — Other Ambulatory Visit: Payer: Self-pay | Admitting: Cardiovascular Disease

## 2015-08-09 ENCOUNTER — Ambulatory Visit: Payer: Self-pay | Admitting: *Deleted

## 2015-08-09 ENCOUNTER — Ambulatory Visit: Payer: Self-pay | Admitting: Cardiovascular Disease

## 2015-09-05 ENCOUNTER — Encounter: Payer: Self-pay | Admitting: Cardiovascular Disease

## 2015-09-05 ENCOUNTER — Ambulatory Visit (INDEPENDENT_AMBULATORY_CARE_PROVIDER_SITE_OTHER): Payer: Medicare Other | Admitting: Cardiovascular Disease

## 2015-09-05 VITALS — BP 120/70 | HR 59 | Ht 68.0 in | Wt 223.5 lb

## 2015-09-05 DIAGNOSIS — I10 Essential (primary) hypertension: Secondary | ICD-10-CM | POA: Diagnosis not present

## 2015-09-05 DIAGNOSIS — Z23 Encounter for immunization: Secondary | ICD-10-CM | POA: Diagnosis not present

## 2015-09-05 DIAGNOSIS — G4700 Insomnia, unspecified: Secondary | ICD-10-CM | POA: Insufficient documentation

## 2015-09-05 DIAGNOSIS — I2510 Atherosclerotic heart disease of native coronary artery without angina pectoris: Secondary | ICD-10-CM | POA: Diagnosis not present

## 2015-09-05 DIAGNOSIS — E785 Hyperlipidemia, unspecified: Secondary | ICD-10-CM | POA: Diagnosis not present

## 2015-09-05 DIAGNOSIS — R5383 Other fatigue: Secondary | ICD-10-CM | POA: Insufficient documentation

## 2015-09-05 NOTE — Assessment & Plan Note (Signed)
Recommended he try Tylenol p.m. He reports that he has tried trazodone with minimal improvement of his symptoms

## 2015-09-05 NOTE — Assessment & Plan Note (Signed)
We have encouraged continued exercise, careful diet management in an effort to lose weight. 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

## 2015-09-05 NOTE — Assessment & Plan Note (Signed)
No recurrent symptoms concerning for angina. Symptoms discussed with him again but he denies having any new issues. No further testing at this time

## 2015-09-05 NOTE — Assessment & Plan Note (Signed)
Etiology of his fatigue is concerning for insomnia. Less likely ischemia. Recommended he try a sleep aid as detailed above. If he continues to have symptoms of fatigue, further testing could be performed He denies any snoring concerning for sleep apnea but this is always a concern

## 2015-09-05 NOTE — Assessment & Plan Note (Signed)
Blood pressure is well controlled on today's visit. No changes made to the medications. 

## 2015-09-05 NOTE — Assessment & Plan Note (Signed)
Currently with no symptoms of angina. No further workup at this time. Continue current medication regimen. 

## 2015-09-05 NOTE — Progress Notes (Signed)
Patient ID: Sean [name], male    DOB: 1941-10-03, 74 y.o.   MRN: [id number]  HPI Comments: 74 year old gentleman with history of coronary artery disease, stent to his RCA in 2007, normal stress test in March 2011, chest pain earlier in 2013 with repeat stress testing showing no significant ischemia who presents to the office for routine followup  Of his coronary artery disease  he has a full-time job in the [employer] operational [employer] . 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 Significant problems with fatigue On further discussion, his sleep is poor, sleeps for 1.5 hours then wakes up, awake for several hours at a time on a regular basis. Started on B-12 by primary care Reports having lots of chest congestion. No regular exercise but reports that he is busy fixing old cars No aerobic exercise He is taking simvastatin on a regular basis. Previously held simvastatin with cholesterol of 60 points   blood pressure well controlled at home, weight stable  EKG on today's visit shows normal sinus rhythm with rate 59 bpm, no significant ST or T-wave changes. APCs noted   Allergies  Allergen Reactions  . Ciprofloxacin     Current Outpatient Prescriptions on File Prior to Visit  Medication Sig Dispense Refill  . aspirin 81 MG tablet Take 162 mg by mouth daily.     . clopidogrel (PLAVIX) 75 MG tablet TAKE 1 TABLET BY MOUTH DAILY 90 tablet 3  . fenofibrate 160 MG tablet TAKE 1 TABLET BY MOUTH DAILY 30 tablet 6  . GLUCOSAMINE-CHONDROITIN PO Take by mouth daily. 2 CAPS DAILY     . metoprolol tartrate (LOPRESSOR) 25 MG tablet Take 1 tablet (25 mg total) by mouth daily. 180 tablet 3  . Multiple Vitamin (MULTIVITAMIN) tablet Take 1 tablet by mouth daily.      . nitroGLYCERIN (NITROSTAT) 0.4 MG SL tablet Place 1 tablet (0.4 mg total) under the tongue every 5 (five) minutes as needed. 25 tablet 1  . pantoprazole (PROTONIX) 40 MG  tablet TAKE 1 TABLET BY MOUTH DAILY 90 tablet 3  . PARoxetine (PAXIL) 10 MG tablet Take 5 mg by mouth every morning.      . simvastatin (ZOCOR) 80 MG tablet TAKE ONE TABLET BY MOUTH EVERY NIGHT AT BEDTIME 90 tablet 3  . valsartan (DIOVAN) 80 MG tablet Take 1 tablet (80 mg total) by mouth daily. 30 tablet 3   No current facility-administered medications on file prior to visit.    Past Medical History  Diagnosis Date  . Chest pain   . GERD (gastroesophageal reflux disease)   . Coronary artery disease 01/2006    STATUS POST PERCUTANEOUS CORONARY INTERVENTION TO THE RIGHT CORONARY ARTERY  . Hypertension   . Hypercholesterolemia   . Obesity   . Depression   . Nausea & vomiting   . TIA (transient ischemic attack) 2009  . Diverticulitis   . Diarrhea     Past Surgical History  Procedure Laterality Date  . Appendectomy    . Lumbar fusion    . Cardiac catheterization      Social History  reports that he quit smoking about 34 years ago. His smoking use included Cigarettes. He has a 17 pack-year smoking history. He does not have any smokeless tobacco history on file. He reports that he drinks alcohol. He reports that he does not use illicit drugs.  Family History family history includes Hypertension in  his father.   Review of Systems  Constitutional: Negative.   HENT: Negative.   Eyes: Negative.   Respiratory: Negative.   Cardiovascular: Negative.   Gastrointestinal: Negative.   Endocrine: Negative.   Musculoskeletal: Negative.   Skin: Negative.   Allergic/Immunologic: Negative.   Neurological: Negative.   Hematological: Negative.   Psychiatric/Behavioral: Negative.   All other systems reviewed and are negative.   BP 120/70 mmHg  Pulse 59  Ht  (1.727 m)  Wt 223 lb 8 oz (101.379 kg)  BMI 33.99 kg/m2  Physical Exam  Constitutional: He is oriented to person, place, and time. He appears well-developed and well-nourished.  HENT:  Head: Normocephalic.  Nose: Nose  normal.  Mouth/Throat: Oropharynx is clear and moist.  Eyes: Conjunctivae are normal. Pupils are equal, round, and reactive to light.  Neck: Normal range of motion. Neck supple. No JVD present.  Cardiovascular: Normal rate, regular rhythm, S1 normal, S2 normal, normal heart sounds and intact distal pulses.  Exam reveals no gallop and no friction rub.   No murmur heard. Pulmonary/Chest: Effort normal and breath sounds normal. No respiratory distress. He has no wheezes. He has no rales. He exhibits no tenderness.  Abdominal: Soft. Bowel sounds are normal. He exhibits no distension. There is no tenderness.  Musculoskeletal: Normal range of motion. He exhibits no edema or tenderness.  Lymphadenopathy:    He has no cervical adenopathy.  Neurological: He is alert and oriented to person, place, and time. Coordination normal.  Skin: Skin is warm and dry. No rash noted. No erythema.  Psychiatric: He has a normal mood and affect. His behavior is normal. Judgment and thought content normal.      Assessment and Plan   Nursing note and vitals reviewed.

## 2015-09-05 NOTE — Patient Instructions (Signed)
You are doing well. No medication changes were made.  Consider trying the generic of zyrtec/ cetirazine   Look at dose of trazodone (you may need to go up to 100 mg every night) Try tylenol PM for sleep   We will check liver and lpids  Please call us if you have new issues that need to be addressed before your next appt.  Your physician wants you to follow-up in: 12 months.  You will receive a reminder letter in the mail two months in advance. If you don't receive a letter, please call our office to schedule the follow-up appointment.

## 2015-09-06 ENCOUNTER — Other Ambulatory Visit: Payer: Self-pay

## 2015-09-06 DIAGNOSIS — E785 Hyperlipidemia, unspecified: Secondary | ICD-10-CM

## 2015-09-06 LAB — HEPATIC FUNCTION PANEL
ALK PHOS: 49 IU/L (ref 39–117)
ALT: 28 IU/L (ref 0–44)
AST: 24 IU/L (ref 0–40)
Albumin: 4.3 g/dL (ref 3.5–4.8)
BILIRUBIN TOTAL: 0.6 mg/dL (ref 0.0–1.2)
Bilirubin, Direct: 0.16 mg/dL (ref 0.00–0.40)
Total Protein: 6.6 g/dL (ref 6.0–8.5)

## 2015-09-06 LAB — LIPID PANEL
CHOLESTEROL TOTAL: 152 mg/dL (ref 100–199)
Chol/HDL Ratio: 5.4 ratio units — ABNORMAL HIGH (ref 0.0–5.0)
HDL: 28 mg/dL — ABNORMAL LOW (ref >39)
LDL Calculated: 99 mg/dL (ref 0–99)
Triglycerides: 126 mg/dL (ref 0–149)
VLDL Cholesterol Cal: 25 mg/dL (ref 5–40)

## 2015-10-09 NOTE — Progress Notes (Signed)
This encounter was created in error - please disregard.

## 2015-11-01 ENCOUNTER — Telehealth: Payer: Self-pay

## 2015-11-01 NOTE — Telephone Encounter (Signed)
Left detailed message on pt's vm w/ Dr. Gollan's recommendation. Asked him to call back w/ any questions or concerns.  

## 2015-11-01 NOTE — Telephone Encounter (Signed)
Pt states that you offered to prescribe trazadone for him. Are you willing to prescribe this or should he contact PCP?

## 2015-11-01 NOTE — Telephone Encounter (Signed)
Pt would like rx for 50 mg Trazadone. States he took some of his wife's and it worked [employer]. Please call.

## 2015-11-01 NOTE — Telephone Encounter (Signed)
This should come from primary care as it is a noncardiac medication

## 2015-12-04 IMAGING — CR ORBITS FOR FOREIGN BODY - 2 VIEW
1 series · 2 of 2 positions shown · non-contrast
Comparison: None.

CLINICAL DATA: Metal working/exposure; clearance prior to MRI

EXAM:
ORBITS FOR FOREIGN BODY - 2 VIEW

[Series 1: dxr orbits for mri clearance · 0.14mm/px · 2 of 2 slices shown]
[im 1/2]
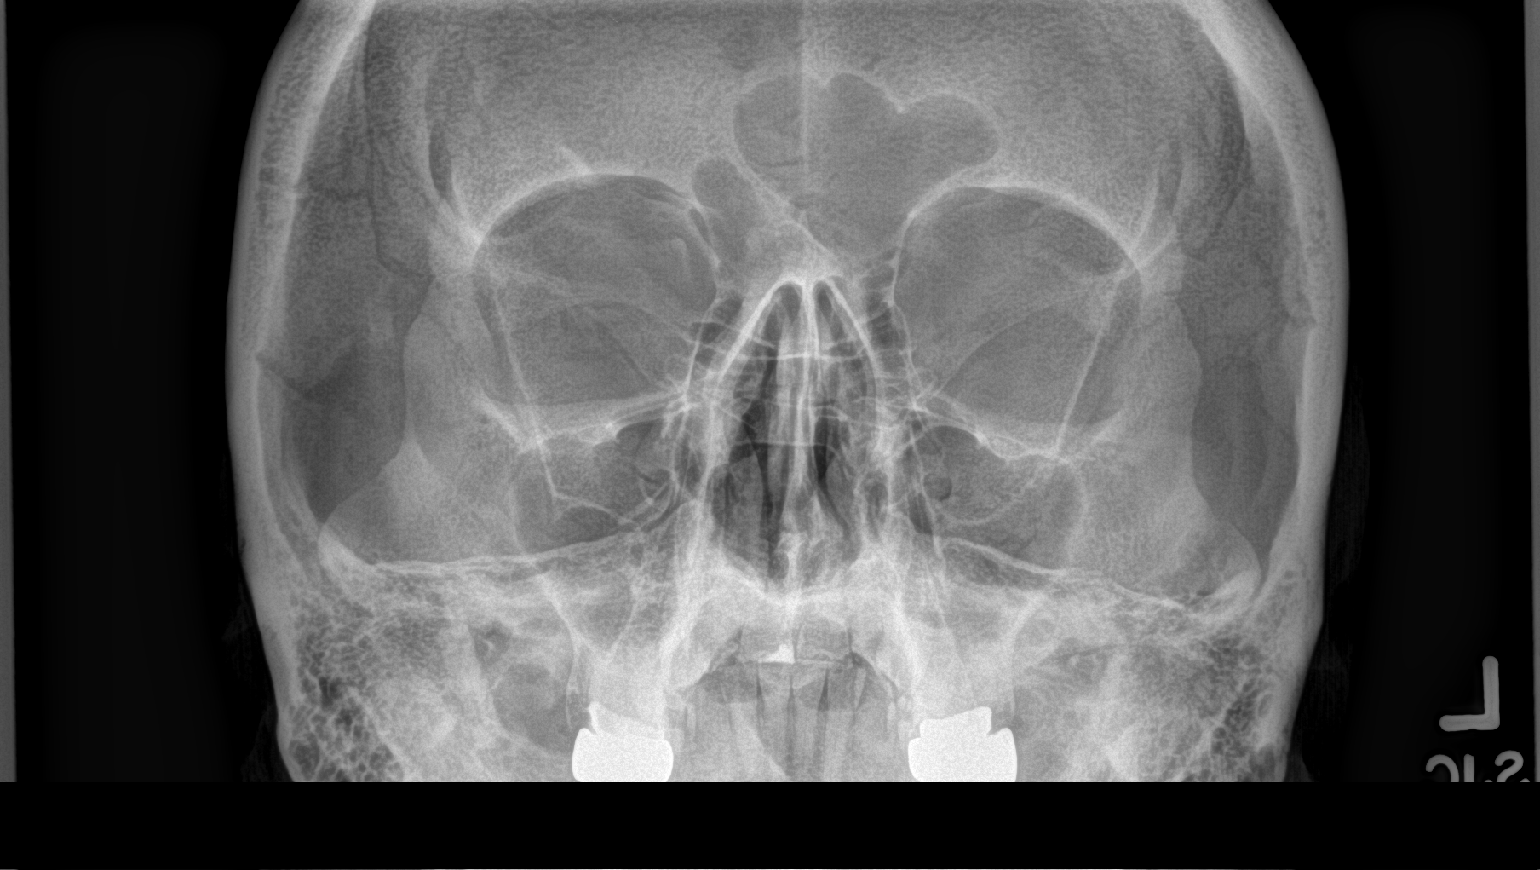
[im 2/2]
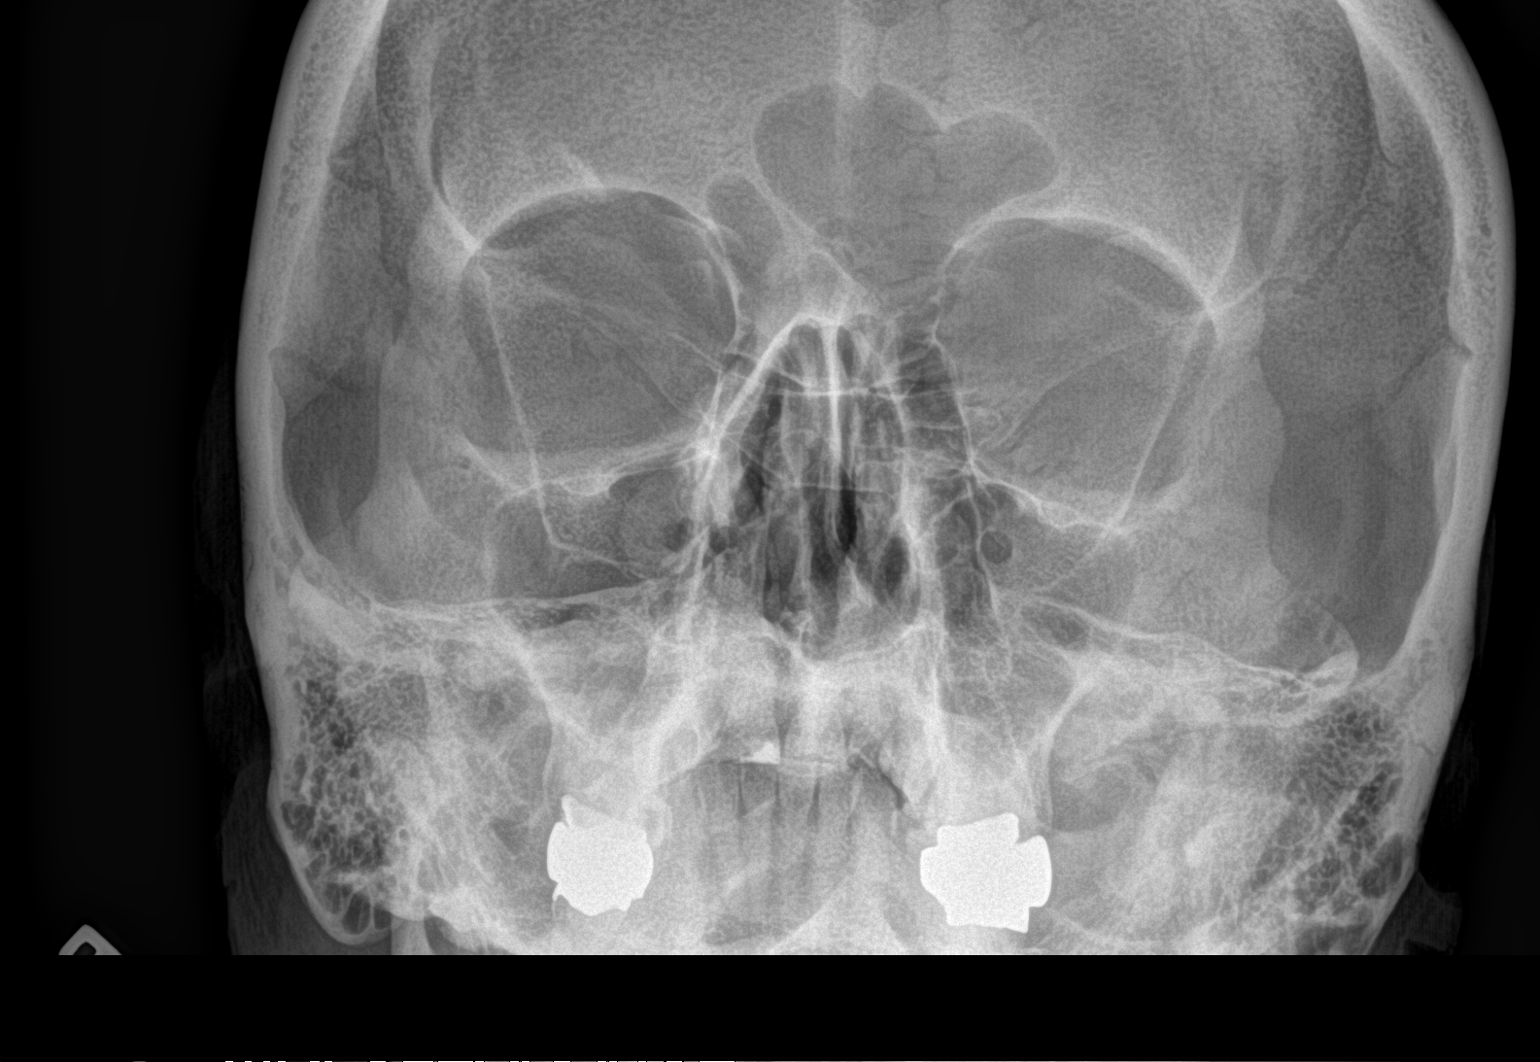

[2 of 2 positions shown; findings below may reference images not displayed]

FINDINGS: There is no evidence of metallic foreign body within the orbits. No
significant bone abnormality identified.
IMPRESSION: No evidence of metallic foreign body within the orbits.

## 2015-12-15 ENCOUNTER — Other Ambulatory Visit: Payer: Self-pay | Admitting: Cardiovascular Disease

## 2015-12-25 ENCOUNTER — Other Ambulatory Visit: Payer: Medicare Other

## 2016-01-01 ENCOUNTER — Other Ambulatory Visit: Payer: Self-pay | Admitting: Cardiovascular Disease

## 2016-04-13 ENCOUNTER — Emergency Department: Payer: Medicare Other

## 2016-04-13 ENCOUNTER — Emergency Department
Admission: EM | Admit: 2016-04-13 | Discharge: 2016-04-13 | Disposition: A | Discharge status: Home / Self Care | Payer: Medicare Other | Attending: Emergency Medicine | Admitting: Emergency Medicine

## 2016-04-13 DIAGNOSIS — I2510 Atherosclerotic heart disease of native coronary artery without angina pectoris: Secondary | ICD-10-CM | POA: Insufficient documentation

## 2016-04-13 DIAGNOSIS — R509 Fever, unspecified: Secondary | ICD-10-CM | POA: Diagnosis present

## 2016-04-13 DIAGNOSIS — Z79899 Other long term (current) drug therapy: Secondary | ICD-10-CM | POA: Insufficient documentation

## 2016-04-13 DIAGNOSIS — R197 Diarrhea, unspecified: Secondary | ICD-10-CM | POA: Diagnosis not present

## 2016-04-13 DIAGNOSIS — F329 Major depressive disorder, single episode, unspecified: Secondary | ICD-10-CM | POA: Insufficient documentation

## 2016-04-13 DIAGNOSIS — Z7982 Long term (current) use of aspirin: Secondary | ICD-10-CM | POA: Insufficient documentation

## 2016-04-13 DIAGNOSIS — Z87891 Personal history of nicotine dependence: Secondary | ICD-10-CM | POA: Insufficient documentation

## 2016-04-13 DIAGNOSIS — Z8673 Personal history of transient ischemic attack (TIA), and cerebral infarction without residual deficits: Secondary | ICD-10-CM | POA: Diagnosis not present

## 2016-04-13 DIAGNOSIS — I10 Essential (primary) hypertension: Secondary | ICD-10-CM | POA: Insufficient documentation

## 2016-04-13 DIAGNOSIS — M791 Myalgia, unspecified site: Secondary | ICD-10-CM

## 2016-04-13 LAB — CBC
HEMATOCRIT: 53.1 % — AB (ref 40.0–52.0)
HEMOGLOBIN: 17.9 g/dL (ref 13.0–18.0)
MCH: 31 pg (ref 26.0–34.0)
MCHC: 33.7 g/dL (ref 32.0–36.0)
MCV: 92 fL (ref 80.0–100.0)
Platelets: 175 10*3/uL (ref 150–440)
RBC: 5.77 MIL/uL (ref 4.40–5.90)
RDW: 13.4 % (ref 11.5–14.5)
WBC: 6.7 10*3/uL (ref 3.8–10.6)

## 2016-04-13 LAB — BASIC METABOLIC PANEL
Anion gap: 7 (ref 5–15)
BUN: 14 mg/dL (ref 6–20)
CO2: 26 mmol/L (ref 22–32)
CREATININE: 1.22 mg/dL (ref 0.61–1.24)
Calcium: 9.5 mg/dL (ref 8.9–10.3)
Chloride: 104 mmol/L (ref 101–111)
GFR calc non Af Amer: 57 mL/min — ABNORMAL LOW (ref >60)
GLUCOSE: 150 mg/dL — AB (ref 65–99)
Potassium: 3.7 mmol/L (ref 3.5–5.1)
Sodium: 137 mmol/L (ref 135–145)

## 2016-04-13 LAB — URINALYSIS COMPLETE WITH MICROSCOPIC (ARMC ONLY)
BACTERIA UA: NONE SEEN
Bilirubin Urine: NEGATIVE
Glucose, UA: NEGATIVE mg/dL
HGB URINE DIPSTICK: NEGATIVE
Ketones, ur: NEGATIVE mg/dL
Leukocytes, UA: NEGATIVE
NITRITE: NEGATIVE
Protein, ur: NEGATIVE mg/dL
SPECIFIC GRAVITY, URINE: 1.019 (ref 1.005–1.030)
pH: 6 (ref 5.0–8.0)

## 2016-04-13 LAB — RAPID INFLUENZA A&B ANTIGENS
Influenza A (ARMC): NEGATIVE
Influenza B (ARMC): NEGATIVE

## 2016-04-13 MED ORDER — SODIUM CHLORIDE 0.9 % IV BOLUS (SEPSIS)
1000.0000 mL | Freq: Once | INTRAVENOUS | Status: AC
  Administered 2016-04-13: 1000 mL via INTRAVENOUS

## 2016-04-13 NOTE — Discharge Instructions (Signed)
Please make a follow-up appointment with your primary care physician. Please drink plenty of fluids and get plenty of rest. You may alternate Tylenol and Motrin for fever and body aches.  Return to the emergency department if you develop severe pain, lightheadedness or fainting, inability to keep down fluids, shortness of breath, or any other symptoms concerning to you.

## 2016-04-13 NOTE — ED Notes (Signed)
Pt in with fever since yest states has had chills and diaphoresis, also co having urinary frequency.   Took tylenol at 1800 for temp of 103 at home.

## 2016-04-13 NOTE — ED Provider Notes (Signed)
[facility] Emergency Department Provider Note  ____________________________________________  Time seen: Approximately 10:45 PM  I have reviewed the triage vital signs and the nursing notes.   HISTORY  Chief Complaint Fever    HPI Sean [name] is a 75 y.o. male with a history of HTN, HL, CAD presenting with fever and chills. The patient reports that today he has had a fever as high as 103.0 at home. This is been associated with a single episode of loose stool this morning, without nausea vomiting or abdominal pain. He has had some urinary frequency, increased energy, and diffuse myalgias. No cough or cold symptoms, rhinorrhea or congestion, sore throat, ear pain, or joint pain.Today he has decreased his by mouth intake and has only had one Coke to drink. No known sick contacts or travel outside the [place].   Past Medical History  Diagnosis Date  . Chest pain   . GERD (gastroesophageal reflux disease)   . Coronary artery disease 01/2006    STATUS POST PERCUTANEOUS CORONARY INTERVENTION TO THE RIGHT CORONARY ARTERY  . Hypertension   . Hypercholesterolemia   . Obesity   . Depression   . Nausea & vomiting   . TIA (transient ischemic attack) 2009  . Diverticulitis   . Diarrhea     Patient Active Problem List   Diagnosis Date Noted  . Insomnia 09/05/2015  . Fatigue 09/05/2015  . Left arm pain 08/27/2014  . Sigmoid diverticulitis 12/16/2011  . Angina pectoris (HCC)   . GERD (gastroesophageal reflux disease)   . Hypertension   . Hypercholesterolemia   . Obesity   . Depression   . Nausea & vomiting   . Coronary artery disease 01/28/2006    Past Surgical History  Procedure Laterality Date  . Appendectomy    . Lumbar fusion    . Cardiac catheterization      Current Outpatient Rx  Name  Route  Sig  Dispense  Refill  . aspirin 81 MG tablet   Oral   Take 162 mg by mouth daily.          . clopidogrel (PLAVIX) 75 MG tablet     TAKE 1 TABLET BY MOUTH DAILY   90 tablet   3   . fenofibrate 160 MG tablet      TAKE 1 TABLET BY MOUTH DAILY   30 tablet   3   . GLUCOSAMINE-CHONDROITIN PO   Oral   Take by mouth daily. 2 CAPS DAILY          . metoprolol tartrate (LOPRESSOR) 25 MG tablet      TAKE 1 TABLET BY MOUTH DAILY   180 tablet   3   . Multiple Vitamin (MULTIVITAMIN) tablet   Oral   Take 1 tablet by mouth daily.           . nitroGLYCERIN (NITROSTAT) 0.4 MG SL tablet   Sublingual   Place 1 tablet (0.4 mg total) under the tongue every 5 (five) minutes as needed.   25 tablet   1   . pantoprazole (PROTONIX) 40 MG tablet      TAKE 1 TABLET BY MOUTH DAILY   90 tablet   3   . PARoxetine (PAXIL) 10 MG tablet   Oral   Take 5 mg by mouth every morning.           . simvastatin (ZOCOR) 80 MG tablet      TAKE ONE TABLET BY MOUTH EVERY NIGHT AT BEDTIME  90 tablet   3   . valsartan (DIOVAN) 80 MG tablet   Oral   Take 1 tablet (80 mg total) by mouth daily.   30 tablet   3     Allergies Ciprofloxacin  Family History  Problem Relation Age of Onset  . Hypertension Father     Social History Social History  Substance Use Topics  . Smoking status: Former Smoker -- 1.00 packs/day for 17 years    Types: Cigarettes    Quit date: 03/10/1981  . Smokeless tobacco: Not on file  . Alcohol Use: Yes     Comment: OCCASSIONALLY    Review of Systems Constitutional: Positive fever and chills. No lightheadedness or syncope. Eyes: No visual changes. No eye discharge. ENT: No sore throat. No congestion or rhinorrhea. No neck stiffness. Cardiovascular: Denies chest pain. Denies palpitations. Respiratory: Denies shortness of breath.  No cough. Gastrointestinal: No abdominal pain.  No nausea, no vomiting.  Positive diarrhea.  No constipation. Genitourinary: Negative for dysuria. Positive urinary frequency. Musculoskeletal: Negative for back pain. Skin: Negative for rash. Neurological:  Negative for headaches. No focal numbness, tingling or weakness.   10-point ROS otherwise negative.  ____________________________________________   PHYSICAL EXAM:  VITAL SIGNS: ED Triage Vitals  Enc Vitals Group     BP 04/13/16 1927 148/77 mmHg     Pulse Rate 04/13/16 1927 102     Resp 04/13/16 1927 20     Temp 04/13/16 1927 100.9 F (38.3 C)     Temp Source 04/13/16 1927 Oral     SpO2 04/13/16 1927 96 %     Weight 04/13/16 1927 205 lb (92.987 kg)     Height 04/13/16 1927 5\' 9"  (1.753 m)     Head Cir --      Peak Flow --      Pain Score --      Pain Loc --      Pain Edu? --      Excl. in GC? --     Constitutional: Alert and oriented. Well appearing and in no acute distress. Answers questions appropriately. Eyes: Conjunctivae are normal.  EOMI. No scleral icterus.No eye discharge. Head: Atraumatic. Nose: No congestion/rhinnorhea. Mouth/Throat: Mucous membranes are moist. No posterior pharyngeal erythema. No tonsillar swelling or exudate. Posterior palate is symmetric and uvula is midline. Neck: No stridor.  Supple.  Full range of motion without pain. No meningismus. Cardiovascular: Normal rate, regular rhythm. No murmurs, rubs or gallops.  Respiratory: Normal respiratory effort.  No accessory muscle use or retractions. Lungs CTAB.  No wheezes, rales or ronchi. Gastrointestinal: Soft, nontender and nondistended.  No guarding or rebound.  No peritoneal signs. Musculoskeletal: No LE edema. No ttp in the calves or palpable cords.  Negative Homan's sign. Full range of motion of all the joints without any erythema, swelling or pain. Neurologic:  A&Ox3.  Speech is clear.  Face and smile are symmetric.  EOMI.  Moves all extremities well. Skin:  Skin is warm, dry and intact. No rash noted. Psychiatric: Mood and affect are normal. Speech and behavior are normal.  Normal judgement.  ____________________________________________   LABS (all labs ordered are listed, but only abnormal  results are displayed)  Labs Reviewed  CBC - Abnormal; Notable for the following:    HCT 53.1 (*)    All other components within normal limits  BASIC METABOLIC PANEL - Abnormal; Notable for the following:    Glucose, Bld 150 (*)    GFR calc non Af Amer 57 (*)  All other components within normal limits  URINALYSIS COMPLETEWITH MICROSCOPIC ([facility] ONLY) - Abnormal; Notable for the following:    Color, Urine YELLOW (*)    APPearance CLEAR (*)    Squamous Epithelial / LPF 0-5 (*)    All other components within normal limits  RAPID INFLUENZA A&B ANTIGENS ([facility] ONLY)  CULTURE, BLOOD (ROUTINE X 2)  CULTURE, BLOOD (ROUTINE X 2)   ____________________________________________  EKG  Not indicated ____________________________________________  RADIOLOGY  No results found.  ____________________________________________   PROCEDURES  Procedure(s) performed: None  Critical Care performed: No ____________________________________________   INITIAL IMPRESSION / ASSESSMENT AND PLAN / ED COURSE  Pertinent labs & imaging results that were available during my care of the patient were reviewed by me and considered in my medical decision making (see chart for details).  75 y.o. male with fever to 103 and a single episode of diarrhea, as well as myalgias and decreased energy. The patient's urinalysis does not show signs of infection. His white blood cell count is normal. He does have an elevated hematocrit today in the setting of fever I would consider blood-borne malignancy such as leukemia but it is unlikely to have only a single cell line that would be abnormal and by only a small amount. The more likely etiology of this is hemoconcentration due to poor by mouth intake today. I am awaiting flu swab and chest x-ray. He he has blood cultures that have been sent and will be followed up his primary care physician. If we do not find any source for his fever today, he will follow-up with his primary  care physician in the next 2-3 days,  ____________________________________________  FINAL CLINICAL IMPRESSION(S) / ED DIAGNOSES  Final diagnoses:  Fever, unspecified fever cause  Diarrhea, unspecified type  Myalgia      NEW MEDICATIONS STARTED DURING THIS VISIT:  New Prescriptions   No medications on file     Rockne Menghini, MD 04/13/16 2319

## 2016-04-18 LAB — CULTURE, BLOOD (ROUTINE X 2)
CULTURE: NO GROWTH
Culture: NO GROWTH

## 2016-04-24 ENCOUNTER — Ambulatory Visit (INDEPENDENT_AMBULATORY_CARE_PROVIDER_SITE_OTHER): Payer: Medicare Other | Admitting: Cardiovascular Disease

## 2016-04-24 ENCOUNTER — Encounter: Payer: Self-pay | Admitting: Cardiovascular Disease

## 2016-04-24 VITALS — BP 110/70 | HR 70 | Ht 69.0 in | Wt 210.8 lb

## 2016-04-24 DIAGNOSIS — I2510 Atherosclerotic heart disease of native coronary artery without angina pectoris: Secondary | ICD-10-CM

## 2016-04-24 DIAGNOSIS — I10 Essential (primary) hypertension: Secondary | ICD-10-CM | POA: Diagnosis not present

## 2016-04-24 DIAGNOSIS — E7800 Pure hypercholesterolemia, unspecified: Secondary | ICD-10-CM | POA: Diagnosis not present

## 2016-04-24 DIAGNOSIS — E669 Obesity, unspecified: Secondary | ICD-10-CM | POA: Diagnosis not present

## 2016-04-24 NOTE — Progress Notes (Signed)
Patient ID: Sean [name], male    DOB: March 17, 1941, 75 y.o.   MRN: [id number]  HPI Comments: 75 year old gentleman with history of coronary artery disease, stent to his RCA in 2007, normal stress test in March 2011, chest pain earlier in 2013 with repeat stress testing showing no significant ischemia who presents to the office for routine followup  Of his coronary artery disease  he has a full-time job in the [employer] .   he is currently remodeling cars  In follow-up, he reports doing well Continues to have problems with fatigue, or sleep. Tried trazodone, did not seem to work well. Also started on B-12, feels this helped his energy Denies any significant chest pain or shortness of breath concerning for ischemia  No regular exercise but reports that he is busy fixing old cars He is taking simvastatin on a regular basis.   blood pressure well controlled at home, weight stable  Recent episode of chills, fevers, high temperatures 04/13/2016, blood cultures negative Hospital records reviewed. Treated with doxycycline per the patient with improvement of his symptoms  Review of lab work shows hemoglobin A1c 6.5 down to 6.1 Total cholesterol 151, LDL 93  EKG on today's visit shows normal sinus rhythm with rate 66 bpm, no significant ST or T-wave changes, rare APC   Allergies  Allergen Reactions  . Ciprofloxacin     Current Outpatient Prescriptions on File Prior to Visit  Medication Sig Dispense Refill  . aspirin 81 MG tablet Take 162 mg by mouth daily.     . clopidogrel (PLAVIX) 75 MG tablet TAKE 1 TABLET BY MOUTH DAILY 90 tablet 3  . fenofibrate 160 MG tablet TAKE 1 TABLET BY MOUTH DAILY 30 tablet 3  . GLUCOSAMINE-CHONDROITIN PO Take by mouth daily. 2 CAPS DAILY     . metoprolol tartrate (LOPRESSOR) 25 MG tablet TAKE 1 TABLET BY MOUTH DAILY 180 tablet 3  . Multiple Vitamin (MULTIVITAMIN) tablet Take 1 tablet by mouth daily.      . nitroGLYCERIN (NITROSTAT) 0.4  MG SL tablet Place 1 tablet (0.4 mg total) under the tongue every 5 (five) minutes as needed. 25 tablet 1  . pantoprazole (PROTONIX) 40 MG tablet TAKE 1 TABLET BY MOUTH DAILY 90 tablet 3  . PARoxetine (PAXIL) 10 MG tablet Take 5 mg by mouth every morning.      . simvastatin (ZOCOR) 80 MG tablet TAKE ONE TABLET BY MOUTH EVERY NIGHT AT BEDTIME 90 tablet 3  . valsartan (DIOVAN) 80 MG tablet Take 1 tablet (80 mg total) by mouth daily. 30 tablet 3   No current facility-administered medications on file prior to visit.    Past Medical History  Diagnosis Date  . Chest pain   . GERD (gastroesophageal reflux disease)   . Coronary artery disease 01/2006    STATUS POST PERCUTANEOUS CORONARY INTERVENTION TO THE RIGHT CORONARY ARTERY  . Hypertension   . Hypercholesterolemia   . Obesity   . Depression   . Nausea & vomiting   . TIA (transient ischemic attack) 2009  . Diverticulitis   . Diarrhea     Past Surgical History  Procedure Laterality Date  . Appendectomy    . Lumbar fusion    . Cardiac catheterization      Social History  reports that he quit smoking about 35 years ago. His smoking use included Cigarettes. He has a 17 pack-year smoking history. He does not have any smokeless tobacco history on file. He reports that he  drinks alcohol. He reports that he does not use illicit drugs.  Family History family history includes Hypertension in his father.   Review of Systems  Constitutional: Negative.   HENT: Negative.   Eyes: Negative.   Respiratory: Negative.   Cardiovascular: Negative.   Gastrointestinal: Negative.   Endocrine: Negative.   Musculoskeletal: Negative.   Skin: Negative.   Allergic/Immunologic: Negative.   Neurological: Negative.   Hematological: Negative.   Psychiatric/Behavioral: Negative.   All other systems reviewed and are negative.   BP 110/70 mmHg  Pulse 70  Ht  (1.753 m)  Wt 210 lb 12 oz (95.596 kg)  BMI 31.11 kg/m2  Physical Exam   Constitutional: He is oriented to person, place, and time. He appears well-developed and well-nourished.  HENT:  Head: Normocephalic.  Nose: Nose normal.  Mouth/Throat: Oropharynx is clear and moist.  Eyes: Conjunctivae are normal. Pupils are equal, round, and reactive to light.  Neck: Normal range of motion. Neck supple. No JVD present.  Cardiovascular: Normal rate, regular rhythm, S1 normal, S2 normal, normal heart sounds and intact distal pulses.  Exam reveals no gallop and no friction rub.   No murmur heard. Pulmonary/Chest: Effort normal and breath sounds normal. No respiratory distress. He has no wheezes. He has no rales. He exhibits no tenderness.  Abdominal: Soft. Bowel sounds are normal. He exhibits no distension. There is no tenderness.  Musculoskeletal: Normal range of motion. He exhibits no edema or tenderness.  Lymphadenopathy:    He has no cervical adenopathy.  Neurological: He is alert and oriented to person, place, and time. Coordination normal.  Skin: Skin is warm and dry. No rash noted. No erythema.  Psychiatric: He has a normal mood and affect. His behavior is normal. Judgment and thought content normal.      Assessment and Plan   Nursing note and vitals reviewed.

## 2016-04-24 NOTE — Assessment & Plan Note (Signed)
We have encouraged continued exercise, careful diet management in an effort to lose weight. 

## 2016-04-24 NOTE — Assessment & Plan Note (Signed)
Currently with no symptoms of angina. No further workup at this time. Continue current medication regimen. 

## 2016-04-24 NOTE — Assessment & Plan Note (Signed)
Blood pressure is well controlled on today's visit. No changes made to the medications. 

## 2016-04-24 NOTE — Assessment & Plan Note (Signed)
Long discussion concerning his lipids LDL is above goal. We did discuss starting zetia. He prefers to leave his medications as they are for now

## 2016-04-24 NOTE — Patient Instructions (Signed)
You are doing well. No medication changes were made.  Please call us if you have new issues that need to be addressed before your next appt.  Your physician wants you to follow-up in: 12 months.  You will receive a reminder letter in the mail two months in advance. If you don't receive a letter, please call our office to schedule the follow-up appointment. 

## 2016-05-11 ENCOUNTER — Other Ambulatory Visit: Payer: Self-pay | Admitting: Cardiovascular Disease

## 2016-07-27 ENCOUNTER — Other Ambulatory Visit: Payer: Self-pay | Admitting: Cardiovascular Disease

## 2016-08-23 ENCOUNTER — Other Ambulatory Visit: Payer: Self-pay | Admitting: Cardiovascular Disease

## 2016-10-05 ENCOUNTER — Other Ambulatory Visit: Payer: Self-pay | Admitting: Cardiovascular Disease

## 2017-01-03 ENCOUNTER — Other Ambulatory Visit: Payer: Self-pay | Admitting: Cardiovascular Disease

## 2017-01-26 ENCOUNTER — Encounter: Payer: Self-pay | Admitting: *Deleted

## 2017-01-26 ENCOUNTER — Emergency Department: Payer: Medicare Other

## 2017-01-26 ENCOUNTER — Observation Stay
Admission: EM | Admit: 2017-01-26 | Discharge: 2017-01-27 | Disposition: A | Discharge status: Home / Self Care | Payer: Medicare Other | Attending: Internal Medicine | Admitting: Internal Medicine

## 2017-01-26 DIAGNOSIS — E7800 Pure hypercholesterolemia, unspecified: Secondary | ICD-10-CM | POA: Diagnosis not present

## 2017-01-26 DIAGNOSIS — Z87891 Personal history of nicotine dependence: Secondary | ICD-10-CM | POA: Insufficient documentation

## 2017-01-26 DIAGNOSIS — Z8673 Personal history of transient ischemic attack (TIA), and cerebral infarction without residual deficits: Secondary | ICD-10-CM | POA: Diagnosis not present

## 2017-01-26 DIAGNOSIS — F329 Major depressive disorder, single episode, unspecified: Secondary | ICD-10-CM | POA: Insufficient documentation

## 2017-01-26 DIAGNOSIS — N400 Enlarged prostate without lower urinary tract symptoms: Secondary | ICD-10-CM | POA: Insufficient documentation

## 2017-01-26 DIAGNOSIS — R0789 Other chest pain: Secondary | ICD-10-CM | POA: Diagnosis not present

## 2017-01-26 DIAGNOSIS — Z7902 Long term (current) use of antithrombotics/antiplatelets: Secondary | ICD-10-CM | POA: Diagnosis not present

## 2017-01-26 DIAGNOSIS — K219 Gastro-esophageal reflux disease without esophagitis: Secondary | ICD-10-CM | POA: Diagnosis not present

## 2017-01-26 DIAGNOSIS — Z7982 Long term (current) use of aspirin: Secondary | ICD-10-CM | POA: Insufficient documentation

## 2017-01-26 DIAGNOSIS — R079 Chest pain, unspecified: Secondary | ICD-10-CM

## 2017-01-26 DIAGNOSIS — Z79899 Other long term (current) drug therapy: Secondary | ICD-10-CM | POA: Insufficient documentation

## 2017-01-26 DIAGNOSIS — I10 Essential (primary) hypertension: Secondary | ICD-10-CM | POA: Diagnosis present

## 2017-01-26 DIAGNOSIS — E785 Hyperlipidemia, unspecified: Secondary | ICD-10-CM | POA: Insufficient documentation

## 2017-01-26 DIAGNOSIS — Z6828 Body mass index (BMI) 28.0-28.9, adult: Secondary | ICD-10-CM | POA: Diagnosis not present

## 2017-01-26 DIAGNOSIS — E669 Obesity, unspecified: Secondary | ICD-10-CM | POA: Insufficient documentation

## 2017-01-26 DIAGNOSIS — Z955 Presence of coronary angioplasty implant and graft: Secondary | ICD-10-CM | POA: Diagnosis not present

## 2017-01-26 DIAGNOSIS — I2510 Atherosclerotic heart disease of native coronary artery without angina pectoris: Secondary | ICD-10-CM | POA: Insufficient documentation

## 2017-01-26 LAB — BASIC METABOLIC PANEL
ANION GAP: 7 (ref 5–15)
BUN: 18 mg/dL (ref 6–20)
CALCIUM: 9.6 mg/dL (ref 8.9–10.3)
CO2: 29 mmol/L (ref 22–32)
CREATININE: 1.23 mg/dL (ref 0.61–1.24)
Chloride: 104 mmol/L (ref 101–111)
GFR, EST NON AFRICAN AMERICAN: 56 mL/min — AB (ref >60)
Glucose, Bld: 140 mg/dL — ABNORMAL HIGH (ref 65–99)
Potassium: 3.8 mmol/L (ref 3.5–5.1)
Sodium: 140 mmol/L (ref 135–145)

## 2017-01-26 LAB — CBC
HCT: 46.2 % (ref 40.0–52.0)
HEMOGLOBIN: 15.6 g/dL (ref 13.0–18.0)
MCH: 31.3 pg (ref 26.0–34.0)
MCHC: 33.7 g/dL (ref 32.0–36.0)
MCV: 92.9 fL (ref 80.0–100.0)
PLATELETS: 275 10*3/uL (ref 150–440)
RBC: 4.97 MIL/uL (ref 4.40–5.90)
RDW: 13.2 % (ref 11.5–14.5)
WBC: 8.6 10*3/uL (ref 3.8–10.6)

## 2017-01-26 LAB — TROPONIN I

## 2017-01-26 NOTE — ED Provider Notes (Signed)
[facility] Emergency Department Provider Note   First MD Initiated Contact with Patient 01/26/17 2319     (approximate)  I have reviewed the triage vital signs and the nursing notes.   HISTORY  Chief Complaint Chest Pain    HPI Sylvester Minton is a 76 y.o. male with below list of chronic medical conditions presents with intermittent central nonradiating chest pain 2 weeks. Patient states that the pain is sharp and brief when it does occur lasting approximately minutes. Patient denies any dyspnea. Patient denies any lower extremity pain or swelling. Patient denies any personal family history of DVT or PE. Patient however does have a history of cardiac stents 2 in 2007. Last stress test was performed in 2011. Patient cardiologist is Dr. Gerald Leitz. He stated that he informed his PCP Dr. Lennice Sites of the chest pain approximately 2 weeks ago at which time EKG was performed which was transmitted to Dr. Mariah Milling. Patient states he did not see that he was told that it was "okay.  Past Medical History:  Diagnosis Date  . Chest pain   . Coronary artery disease 01/2006   STATUS POST PERCUTANEOUS CORONARY INTERVENTION TO THE RIGHT CORONARY ARTERY  . Depression   . Diarrhea   . Diverticulitis   . GERD (gastroesophageal reflux disease)   . Hypercholesterolemia   . Hypertension   . Nausea & vomiting   . Obesity   . TIA (transient ischemic attack) 2009    Patient Active Problem List   Diagnosis Date Noted  . Insomnia 09/05/2015  . Fatigue 09/05/2015  . Left arm pain 08/27/2014  . Sigmoid diverticulitis 12/16/2011  . Angina pectoris (HCC)   . GERD (gastroesophageal reflux disease)   . Hypertension   . Hypercholesterolemia   . Obesity   . Depression   . Nausea & vomiting   . Coronary artery disease 01/28/2006    Past Surgical History:  Procedure Laterality Date  . APPENDECTOMY    . CARDIAC CATHETERIZATION    . LUMBAR FUSION      Prior to Admission  medications   Medication Sig Start Date End Date Taking? Authorizing Provider  aspirin EC 81 MG tablet Take 81 mg by mouth daily.   Yes Historical Provider, MD  clopidogrel (PLAVIX) 75 MG tablet TAKE 1 TABLET BY MOUTH DAILY 07/27/16  Yes Antonieta Iba, MD  fenofibrate 160 MG tablet TAKE 1 TABLET BY MOUTH DAILY 10/06/16  Yes Antonieta Iba, MD  metoprolol tartrate (LOPRESSOR) 25 MG tablet TAKE 1 TABLET BY MOUTH DAILY 01/04/17  Yes Antonieta Iba, MD  Multiple Vitamin (MULTIVITAMIN) tablet Take 1 tablet by mouth daily.     Yes Historical Provider, MD  nitroGLYCERIN (NITROSTAT) 0.4 MG SL tablet Place 1 tablet (0.4 mg total) under the tongue every 5 (five) minutes as needed. 06/08/13  Yes Antonieta Iba, MD  pantoprazole (PROTONIX) 40 MG tablet TAKE 1 TABLET BY MOUTH DAILY 07/27/16  Yes Antonieta Iba, MD  PARoxetine (PAXIL) 10 MG tablet Take 5 mg by mouth every morning.     Yes Historical Provider, MD  simvastatin (ZOCOR) 80 MG tablet TAKE ONE TABLET BY MOUTH EVERY NIGHT AT BEDTIME 08/24/16  Yes Antonieta Iba, MD  tamsulosin (FLOMAX) 0.4 MG CAPS capsule Take 0.4 mg by mouth daily.   Yes Historical Provider, MD  traZODone (DESYREL) 50 MG tablet Take 50 mg by mouth at bedtime as needed for sleep.   Yes Historical Provider, MD  GLUCOSAMINE-CHONDROITIN PO Take  by mouth daily. 2 CAPS DAILY     Historical Provider, MD  valsartan (DIOVAN) 80 MG tablet Take 1 tablet (80 mg total) by mouth daily. Patient not taking: Reported on 01/26/2017 02/20/13   Antonieta Ibaimothy J Gollan, MD    Allergies Ciprofloxacin  Family History  Problem Relation Age of Onset  . Hypertension Father     Social History Social History  Substance Use Topics  . Smoking status: Former Smoker    Packs/day: 1.00    Years: 17.00    Types: Cigarettes    Quit date: 03/10/1981  . Smokeless tobacco: Never Used  . Alcohol use Yes     Comment: OCCASSIONALLY    Review of Systems Constitutional: No fever/chills Eyes: No visual  changes. ENT: No sore throat. Cardiovascular: Positive for some chest pain. Respiratory: Denies shortness of breath. Gastrointestinal: No abdominal pain.  No nausea, no vomiting.  No diarrhea.  No constipation. Genitourinary: Negative for dysuria. Musculoskeletal: Negative for back pain. Skin: Negative for rash. Neurological: Negative for headaches, focal weakness or numbness.  10-point ROS otherwise negative.  ____________________________________________   PHYSICAL EXAM:  VITAL SIGNS: ED Triage Vitals  Enc Vitals Group     BP 01/26/17 2134 129/68     Pulse Rate 01/26/17 2134 66     Resp 01/26/17 2134 (!) 2     Temp 01/26/17 2134 98.4 F (36.9 C)     Temp Source 01/26/17 2134 Oral     SpO2 01/26/17 2134 93 %     Weight 01/26/17 2135 195 lb (88.5 kg)     Height 01/26/17 2135 5\' 9"  (1.753 m)     Head Circumference --      Peak Flow --      Pain Score 01/26/17 2135 0     Pain Loc --      Pain Edu? --      Excl. in GC? --     Constitutional: Alert and oriented. Well appearing and in no acute distress. Eyes: Conjunctivae are normal. PERRL. EOMI. Head: Atraumatic. Ears:  Healthy appearing ear canals and TMs bilaterally Nose: No congestion/rhinnorhea. Mouth/Throat: Mucous membranes are moist.  Oropharynx non-erythematous. Neck: No stridor.  No meningeal signs.  No cervical spine tenderness to palpation. Cardiovascular: Normal rate, regular rhythm. Good peripheral circulation. Grossly normal heart sounds. Respiratory: Normal respiratory effort.  No retractions. Lungs CTAB. Gastrointestinal: Soft and nontender. No distention.   Musculoskeletal: No lower extremity tenderness nor edema. No gross deformities of extremities. Neurologic:  Normal speech and language. No gross focal neurologic deficits are appreciated.  Skin:  Skin is warm, dry and intact. No rash noted. Psychiatric: Mood and affect are normal. Speech and behavior are  normal.  ____________________________________________   LABS (all labs ordered are listed, but only abnormal results are displayed)  Labs Reviewed  BASIC METABOLIC PANEL - Abnormal; Notable for the following:       Result Value   Glucose, Bld 140 (*)    GFR calc non Af Amer 56 (*)    All other components within normal limits  CBC  TROPONIN I  FIBRIN DERIVATIVES D-DIMER ([facility] ONLY)   ____________________________________________  EKG  ED ECG REPORT I, [place] N Sherrol Vicars, the attending physician, personally viewed and interpreted this ECG.   Date: 01/26/2017  EKG Time: 9:41 PM  Rate: 67  Rhythm: Normal sinus rhythm  Axis: Normal  Intervals: Normal  ST&T Change: None  ____________________________________________  RADIOLOGY I, [place] N Claudine Stallings, personally viewed and evaluated these images (plain radiographs)  as part of my medical decision making, as well as reviewing the written report by the radiologist.  Dg Chest 2 View  Result Date: 01/26/2017 CLINICAL DATA:  Subacute onset of intermittent midsternal chest pain and left hand numbness. Initial encounter. EXAM: CHEST  2 VIEW COMPARISON:  Chest radiograph performed 04/13/2016 FINDINGS: The lungs are hypoexpanded. Peribronchial thickening is noted. Mild vascular crowding is seen. No pleural effusion or pneumothorax identified. The heart is normal in size. No acute osseous abnormalities are seen. IMPRESSION: Lungs hypoexpanded.  Peribronchial thickening noted. Electronically Signed   By: Roanna Raider M.D.   On: 01/26/2017 22:08      Procedures    INITIAL IMPRESSION / ASSESSMENT AND PLAN / ED COURSE  Pertinent labs & imaging results that were available during my care of the patient were reviewed by me and considered in my medical decision making (see chart for details).  76 year old male with history of 2 cardiac stents in 2007 presents to the emergency department intermittent chest pain. EKG revealed no evidence of  ischemia or infarction troponin negative likewise d-dimer. Given history of physical exam concerning for possible angina versus other potential cardiac etiology and a such patient was discussed with Dr. Sheryle Hail for hospital admission for further evaluation and management      ____________________________________________  FINAL CLINICAL IMPRESSION(S) / ED DIAGNOSES  Final diagnoses:  Chest pain, unspecified type     MEDICATIONS GIVEN DURING THIS VISIT:  Medications - No data to display   NEW OUTPATIENT MEDICATIONS STARTED DURING THIS VISIT:  New Prescriptions   No medications on file    Modified Medications   No medications on file    Discontinued Medications   ASPIRIN 81 MG TABLET    Take 162 mg by mouth daily.      Note:  This document was prepared using Dragon voice recognition software and may include unintentional dictation errors.    Darci Current, MD 01/27/17 [phone]

## 2017-01-26 NOTE — ED Notes (Signed)
Patient denies pain and is resting comfortably.  

## 2017-01-26 NOTE — ED Triage Notes (Signed)
Pt c/o mid-substernal chest pain, non-reproducible, intermittent. Pt c/o recurrent chest pain x 2 weeks. Pt was seen by his PCP in past 2 weeks for this complaint. Pt denies associated sxs other than chest pain that is sharp and intermittent L hand numbness. Pt in no observable distress at this time.

## 2017-01-27 ENCOUNTER — Other Ambulatory Visit: Payer: Self-pay | Admitting: Cardiovascular Disease

## 2017-01-27 ENCOUNTER — Encounter: Payer: Self-pay | Admitting: Internal Medicine

## 2017-01-27 ENCOUNTER — Telehealth: Payer: Self-pay | Admitting: Cardiovascular Disease

## 2017-01-27 ENCOUNTER — Observation Stay (HOSPITAL_BASED_OUTPATIENT_CLINIC_OR_DEPARTMENT_OTHER): Payer: Medicare Other

## 2017-01-27 ENCOUNTER — Observation Stay: Payer: Medicare Other

## 2017-01-27 DIAGNOSIS — I25118 Atherosclerotic heart disease of native coronary artery with other forms of angina pectoris: Secondary | ICD-10-CM | POA: Diagnosis not present

## 2017-01-27 DIAGNOSIS — E7800 Pure hypercholesterolemia, unspecified: Secondary | ICD-10-CM

## 2017-01-27 DIAGNOSIS — K219 Gastro-esophageal reflux disease without esophagitis: Secondary | ICD-10-CM

## 2017-01-27 DIAGNOSIS — I10 Essential (primary) hypertension: Secondary | ICD-10-CM | POA: Diagnosis not present

## 2017-01-27 DIAGNOSIS — R079 Chest pain, unspecified: Secondary | ICD-10-CM

## 2017-01-27 LAB — NM MYOCAR MULTI W/SPECT W/WALL MOTION / EF
CHL CUP NUCLEAR SDS: 0
CHL CUP RESTING HR STRESS: 58 {beats}/min
CSEPPHR: 72 {beats}/min
LV dias vol: 66 mL (ref 62–150)
LVSYSVOL: 19 mL
NUC STRESS TID: 0.81
SRS: 3
SSS: 1

## 2017-01-27 LAB — LIPID PANEL
CHOLESTEROL: 156 mg/dL (ref 0–200)
HDL: 28 mg/dL — ABNORMAL LOW (ref >40)
LDL Cholesterol: 81 mg/dL (ref 0–99)
TRIGLYCERIDES: 237 mg/dL — AB (ref <150)
Total CHOL/HDL Ratio: 5.6 RATIO
VLDL: 47 mg/dL — ABNORMAL HIGH (ref 0–40)

## 2017-01-27 LAB — TSH: TSH: 2.333 u[IU]/mL (ref 0.350–4.500)

## 2017-01-27 LAB — TROPONIN I

## 2017-01-27 LAB — FIBRIN DERIVATIVES D-DIMER (ARMC ONLY): FIBRIN DERIVATIVES D-DIMER (ARMC): 480.82 (ref 0–499)

## 2017-01-27 MED ORDER — TECHNETIUM TC 99M TETROFOSMIN IV KIT
13.0000 | PACK | Freq: Once | INTRAVENOUS | Status: AC | PRN
  Administered 2017-01-27: 14.14 via INTRAVENOUS

## 2017-01-27 MED ORDER — DOCUSATE SODIUM 100 MG PO CAPS
100.0000 mg | ORAL_CAPSULE | Freq: Two times a day (BID) | ORAL | Status: DC
  Administered 2017-01-27: 100 mg via ORAL
  Filled 2017-01-27: qty 1

## 2017-01-27 MED ORDER — NITROGLYCERIN 0.4 MG SL SUBL: 0.4000 mg | SUBLINGUAL_TABLET | SUBLINGUAL | Status: DC | PRN

## 2017-01-27 MED ORDER — TAMSULOSIN HCL 0.4 MG PO CAPS
0.4000 mg | ORAL_CAPSULE | Freq: Every day | ORAL | Status: DC
  Administered 2017-01-27: 0.4 mg via ORAL
  Filled 2017-01-27: qty 1

## 2017-01-27 MED ORDER — ENOXAPARIN SODIUM 40 MG/0.4ML ~~LOC~~ SOLN
40.0000 mg | Freq: Every day | SUBCUTANEOUS | Status: DC
  Administered 2017-01-27: 40 mg via SUBCUTANEOUS
  Filled 2017-01-27: qty 0.4

## 2017-01-27 MED ORDER — TRAZODONE HCL 50 MG PO TABS: 50.0000 mg | ORAL_TABLET | Freq: Every evening | ORAL | Status: DC | PRN

## 2017-01-27 MED ORDER — ACETAMINOPHEN 650 MG RE SUPP: 650.0000 mg | Freq: Four times a day (QID) | RECTAL | Status: DC | PRN

## 2017-01-27 MED ORDER — ONDANSETRON HCL 4 MG/2ML IJ SOLN: 4.0000 mg | Freq: Four times a day (QID) | INTRAMUSCULAR | Status: DC | PRN

## 2017-01-27 MED ORDER — SODIUM CHLORIDE 0.9 % IV SOLN
INTRAVENOUS | Status: DC
  Administered 2017-01-27: 07:00:00 via INTRAVENOUS

## 2017-01-27 MED ORDER — ACETAMINOPHEN 500 MG PO TABS
1000.0000 mg | ORAL_TABLET | Freq: Once | ORAL | Status: AC
  Administered 2017-01-27: 1000 mg via ORAL

## 2017-01-27 MED ORDER — PAROXETINE HCL 10 MG PO TABS
5.0000 mg | ORAL_TABLET | ORAL | Status: DC
  Administered 2017-01-27: 5 mg via ORAL
  Filled 2017-01-27: qty 0.5

## 2017-01-27 MED ORDER — PANTOPRAZOLE SODIUM 40 MG PO TBEC
40.0000 mg | DELAYED_RELEASE_TABLET | Freq: Every day | ORAL | Status: DC
  Administered 2017-01-27: 40 mg via ORAL
  Filled 2017-01-27: qty 1

## 2017-01-27 MED ORDER — ACETAMINOPHEN 325 MG PO TABS: 650.0000 mg | ORAL_TABLET | Freq: Four times a day (QID) | ORAL | Status: DC | PRN

## 2017-01-27 MED ORDER — ACETAMINOPHEN 500 MG PO TABS
ORAL_TABLET | ORAL | Status: AC
  Administered 2017-01-27: 1000 mg via ORAL
  Filled 2017-01-27: qty 2

## 2017-01-27 MED ORDER — REGADENOSON 0.4 MG/5ML IV SOLN
0.4000 mg | Freq: Once | INTRAVENOUS | Status: AC
  Administered 2017-01-27: 0.4 mg via INTRAVENOUS
  Filled 2017-01-27: qty 5

## 2017-01-27 MED ORDER — ADULT MULTIVITAMIN W/MINERALS CH
1.0000 | ORAL_TABLET | Freq: Every day | ORAL | Status: DC
  Administered 2017-01-27: 1 via ORAL
  Filled 2017-01-27: qty 1

## 2017-01-27 MED ORDER — NITROGLYCERIN 0.4 MG SL SUBL: 0.4000 mg | SUBLINGUAL_TABLET | SUBLINGUAL | 1 refills | Status: AC | PRN

## 2017-01-27 MED ORDER — FENOFIBRATE 160 MG PO TABS
160.0000 mg | ORAL_TABLET | Freq: Every day | ORAL | Status: DC
  Administered 2017-01-27: 160 mg via ORAL
  Filled 2017-01-27: qty 1

## 2017-01-27 MED ORDER — ASPIRIN EC 81 MG PO TBEC
81.0000 mg | DELAYED_RELEASE_TABLET | Freq: Every day | ORAL | Status: DC
  Administered 2017-01-27: 81 mg via ORAL
  Filled 2017-01-27: qty 1

## 2017-01-27 MED ORDER — SODIUM CHLORIDE 0.9% FLUSH
3.0000 mL | Freq: Two times a day (BID) | INTRAVENOUS | Status: DC
  Administered 2017-01-27: 3 mL via INTRAVENOUS

## 2017-01-27 MED ORDER — METOPROLOL TARTRATE 25 MG PO TABS
25.0000 mg | ORAL_TABLET | Freq: Every day | ORAL | Status: DC
  Administered 2017-01-27: 25 mg via ORAL
  Filled 2017-01-27: qty 1

## 2017-01-27 MED ORDER — TECHNETIUM TC 99M TETROFOSMIN IV KIT
30.0000 | PACK | Freq: Once | INTRAVENOUS | Status: AC | PRN
  Administered 2017-01-27: 28.305 via INTRAVENOUS

## 2017-01-27 MED ORDER — ONDANSETRON HCL 4 MG PO TABS: 4.0000 mg | ORAL_TABLET | Freq: Four times a day (QID) | ORAL | Status: DC | PRN

## 2017-01-27 MED ORDER — CLOPIDOGREL BISULFATE 75 MG PO TABS
75.0000 mg | ORAL_TABLET | Freq: Every day | ORAL | Status: DC
  Administered 2017-01-27: 75 mg via ORAL
  Filled 2017-01-27: qty 1

## 2017-01-27 MED ORDER — ATORVASTATIN CALCIUM 20 MG PO TABS: 40.0000 mg | ORAL_TABLET | Freq: Every day | ORAL | Status: DC

## 2017-01-27 NOTE — Discharge Instructions (Signed)
Heart healthy diet

## 2017-01-27 NOTE — Progress Notes (Signed)
Stress test result No ischemia, no significant wall motion abnormality Results discussed with patient over the phone, called into the room He feels comfortable going home, will follow-up with primary care for further GI workup  Recommended he consider taking Pepcid/Tums if he has any recurrent symptoms Also we have renewed his nitroglycerin, recommended he take this for severe symptoms  If he does start taking more nitroglycerin for symptoms, recommended he call our office We will arrange outpatient office follow-up  Signed, Dossie Arbourim Gollan, MD, Ph.D [facility]

## 2017-01-27 NOTE — Progress Notes (Signed)
Patient is discharge home in a stable condition after a negative stress test result , summary and f/u care given to both pt and wife , verbalized understanding , left with wife

## 2017-01-27 NOTE — Consult Note (Signed)
Cardiology Consultation Note  Patient ID: Sean [name], MRN: [id number], DOB/AGE: Apr 03, 1941 76 y.o. Admit date: 01/26/2017   Date of Consult: 01/27/2017 Primary Physician: [name], MD Primary Cardiologist: Dr. Mariah Milling, MD Requesting Physician: Dr. Sheryle Hail, MD  Chief Complaint: Chest pain Reason for Consult: Chest pain  HPI: 76 y.o. male with h/o CAD status post prior stenting to his RCA in 2007, HTN, HLD, GERD, chronic fatigue who presented to [facility] with approximately 3-4 week history of chest pain.  Patient underwent stress testing in March 2011 that was found to be normal. He most recently underwent ischemic evaluation in 2013 via stress test that showed no significant ischemia. No prior echo for review. He was last seen by cardiology on 04/24/2016, and was doing well at that time.  Patient was recently sick with URI approximately 6 weeks ago with significant coughing that was associated with central chest pain. His URI apparently resolved approximately 4 weeks ago however his substernal chest pain has persisted. He reports chest pain is not associated with any activities as it can come on with exertion, sitting in his chair at his computer, or laying down. Chest pain will last from seconds to approximately 1 minute and self resolve without intervention. No associated shortness of breath, palpitations, diaphoresis, nausea, vomiting, dizziness, presyncope, or syncope. Patient was seen by his PCP on 2/21 for evaluation of this chest pain. At that time EKG was performed and apparently reviewed by Dr. Mariah Milling with no acute changes being seen when compared to prior EKG in our office in 03/2016. He continued to have intermittent chest pain as above, however on the night of 2/27 his chest pain was more severe though still continued to only last seconds to approximately 1 minute. Because his chest pain was more severe than his prior episodes he presented to [facility] for further evaluation.  Upon  the patient's arrival to [facility] they were found to have negative troponin 2. Unremarkable CBC. Serum creatinine 1.23 which is at his baseline, potassium 3.8, normal TSH, negative d-dimer. ECG nonacute as below, CXR showed no acute process. Currently chest pain-free.   Past Medical History:  Diagnosis Date  . Chest pain   . Coronary artery disease 01/2006   STATUS POST PERCUTANEOUS CORONARY INTERVENTION TO THE RIGHT CORONARY ARTERY  . Depression   . Diarrhea   . Diverticulitis   . GERD (gastroesophageal reflux disease)   . Hypercholesterolemia   . Hypertension   . Nausea & vomiting   . Obesity   . TIA (transient ischemic attack) 2009      Most Recent Cardiac Studies: As above   Surgical History:  Past Surgical History:  Procedure Laterality Date  . APPENDECTOMY    . CARDIAC CATHETERIZATION    . LUMBAR FUSION       Home Meds: Prior to Admission medications   Medication Sig Start Date End Date Taking? Authorizing Provider  aspirin EC 81 MG tablet Take 81 mg by mouth daily.   Yes Historical Provider, MD  clopidogrel (PLAVIX) 75 MG tablet TAKE 1 TABLET BY MOUTH DAILY 07/27/16  Yes Antonieta Iba, MD  fenofibrate 160 MG tablet TAKE 1 TABLET BY MOUTH DAILY 10/06/16  Yes Antonieta Iba, MD  metoprolol tartrate (LOPRESSOR) 25 MG tablet TAKE 1 TABLET BY MOUTH DAILY 01/04/17  Yes Antonieta Iba, MD  Multiple Vitamin (MULTIVITAMIN) tablet Take 1 tablet by mouth daily.     Yes Historical Provider, MD  nitroGLYCERIN (NITROSTAT) 0.4 MG SL tablet Place 1  tablet (0.4 mg total) under the tongue every 5 (five) minutes as needed. 06/08/13  Yes Antonieta Iba, MD  pantoprazole (PROTONIX) 40 MG tablet TAKE 1 TABLET BY MOUTH DAILY 07/27/16  Yes Antonieta Iba, MD  PARoxetine (PAXIL) 10 MG tablet Take 5 mg by mouth every morning.     Yes Historical Provider, MD  simvastatin (ZOCOR) 80 MG tablet TAKE ONE TABLET BY MOUTH EVERY NIGHT AT BEDTIME 08/24/16  Yes Antonieta Iba, MD  tamsulosin (FLOMAX)  0.4 MG CAPS capsule Take 0.4 mg by mouth daily.   Yes Historical Provider, MD  traZODone (DESYREL) 50 MG tablet Take 50 mg by mouth at bedtime as needed for sleep.   Yes Historical Provider, MD  GLUCOSAMINE-CHONDROITIN PO Take by mouth daily. 2 CAPS DAILY     Historical Provider, MD  valsartan (DIOVAN) 80 MG tablet Take 1 tablet (80 mg total) by mouth daily. Patient not taking: Reported on 01/26/2017 02/20/13   Antonieta Iba, MD    Inpatient Medications:  . aspirin EC  81 mg Oral Daily  . atorvastatin  40 mg Oral q1800  . clopidogrel  75 mg Oral Daily  . docusate sodium  100 mg Oral BID  . enoxaparin (LOVENOX) injection  40 mg Subcutaneous Daily  . fenofibrate  160 mg Oral Daily  . metoprolol tartrate  25 mg Oral Daily  . multivitamin with minerals  1 tablet Oral Daily  . pantoprazole  40 mg Oral QAC breakfast  . PARoxetine  5 mg Oral BH-[id number]  . sodium chloride flush  3 mL Intravenous Q12H  . tamsulosin  0.4 mg Oral Daily   . sodium chloride 125 mL/hr at 01/27/17 0636    Allergies:  Allergies  Allergen Reactions  . Ciprofloxacin     Social History   Social History  . Marital status: Married    Spouse name: N/A  . Number of children: N/A  . Years of education: N/A   Occupational History  . Not on file.   Social History Main Topics  . Smoking status: Former Smoker    Packs/day: 1.00    Years: 17.00    Types: Cigarettes    Quit date: 03/10/1981  . Smokeless tobacco: Never Used  . Alcohol use Yes     Comment: OCCASSIONALLY  . Drug use: No  . Sexual activity: Not on file   Other Topics Concern  . Not on file   Social History Narrative  . No narrative on file     Family History  Problem Relation Age of Onset  . Hypertension Father   . Diabetes Mellitus II Father   . CAD Father   . Kidney failure Father   . CAD Sister      Review of Systems: Review of Systems  Constitutional: Negative for chills, diaphoresis, fever, malaise/fatigue and weight loss.    HENT: Negative for congestion.   Eyes: Negative for discharge and redness.  Respiratory: Positive for cough. Negative for hemoptysis, sputum production, shortness of breath and wheezing.   Cardiovascular: Positive for chest pain. Negative for palpitations, orthopnea, claudication, leg swelling and PND.  Gastrointestinal: Negative for abdominal pain, blood in stool, heartburn, melena, nausea and vomiting.  Genitourinary: Negative for hematuria.  Musculoskeletal: Negative for falls and myalgias.  Skin: Negative for rash.  Neurological: Negative for dizziness, tingling, tremors, sensory change, speech change, focal weakness, loss of consciousness and weakness.  Endo/Heme/Allergies: Does not bruise/bleed easily.  Psychiatric/Behavioral: Negative for substance abuse. The patient is not  nervous/anxious.   All other systems reviewed and are negative.   Labs:  Recent Labs  01/26/17 2143 01/27/17 0102  TROPONINI <0.03 <0.03   Lab Results  Component Value Date   WBC 8.6 01/26/2017   HGB 15.6 01/26/2017   HCT 46.2 01/26/2017   MCV 92.9 01/26/2017   PLT 275 01/26/2017     Recent Labs Lab 01/26/17 2143  NA 140  K 3.8  CL 104  CO2 29  BUN 18  CREATININE 1.23  CALCIUM 9.6  GLUCOSE 140*   Lab Results  Component Value Date   CHOL 152 09/05/2015   HDL 28 (L) 09/05/2015   LDLCALC 99 09/05/2015   TRIG 126 09/05/2015   No results found for: DDIMER  Radiology/Studies:  Dg Chest 2 View  Result Date: 01/26/2017 CLINICAL DATA:  Subacute onset of intermittent midsternal chest pain and left hand numbness. Initial encounter. EXAM: CHEST  2 VIEW COMPARISON:  Chest radiograph performed 04/13/2016 FINDINGS: The lungs are hypoexpanded. Peribronchial thickening is noted. Mild vascular crowding is seen. No pleural effusion or pneumothorax identified. The heart is normal in size. No acute osseous abnormalities are seen. IMPRESSION: Lungs hypoexpanded.  Peribronchial thickening noted.  Electronically Signed   By: Roanna RaiderJeffery  Chang M.D.   On: 01/26/2017 22:08    EKG: Interpreted by me showed: NSR, 67 bpm, possible prior anterior infarct, no acute ST-T changes Telemetry: Interpreted by me showed: Sinus rhythm, 60's  Weights: [employer]   01/26/17 2135  Weight: 195 lb (88.5 kg)     Physical Exam: Blood pressure (!) 142/69, pulse 68, temperature 98 F (36.7 C), temperature source Oral, resp. rate 18, height 5\' 9"  (1.753 m), weight 195 lb (88.5 kg), SpO2 94 %. Body mass index is 28.8 kg/m. General: Well developed, well nourished, in no acute distress. Head: Normocephalic, atraumatic, sclera non-icteric, no xanthomas, nares are without discharge.  Neck: Negative for carotid bruits. JVD not elevated. Lungs: Clear bilaterally to auscultation without wheezes, rales, or rhonchi. Breathing is unlabored. Heart: RRR with S1 S2. No murmurs, rubs, or gallops appreciated. Abdomen: Soft, non-tender, non-distended with normoactive bowel sounds. No hepatomegaly. No rebound/guarding. No obvious abdominal masses. Msk:  Strength and tone appear normal for age. Extremities: No clubbing or cyanosis. No edema. Distal pedal pulses are 2+ and equal bilaterally. Neuro: Alert and oriented X 3. No facial asymmetry. No focal deficit. Moves all extremities spontaneously. Psych:  Responds to questions appropriately with a normal affect.    Assessment and Plan:  Principal Problem:   Chest pain with moderate risk for cardiac etiology Active Problems:   Coronary artery disease   GERD (gastroesophageal reflux disease)   Hypertension   Hypercholesterolemia    1. Chest pain with moderate risk for cardiac etiology: -Currently chest pain-free -Troponin negative 2 -Symptoms appear mostly atypical in nature at this time given sharp chest pain, not associated with exertion, and lasting seconds before self resolving -Schedule for Lexiscan Myoview this morning to evaluate for high-risk ischemia, if  EF is found to be low on Lexiscan will plan for echo prior to discharge otherwise if Lexiscan is normal no further inpatient cardiac workup will be needed at this time -If cardiac workup is unrevealing as inpatient consider alternative etiology will defer to IM  2. CAD as above: -Continue DAPT with ASA 81 mg and Plavix 75 mg per primary cardiologist -Continue Lopressor and Lipitor -Ischemic workup as above  3. HTN: -Currently controlled, continue current medications  4. HLD: -Continue Lipitor -Check lipid panel  5. GERD: -Continue PPI   Signed, Eula Listen, PA-C [facility] Pager: [phone] 01/27/2017, 8:02 AM

## 2017-01-27 NOTE — Telephone Encounter (Signed)
[facility].  Patient understands to follow up with provider Dr. Mariah MillingGollan on 02/10/17 at 11:00AM [facility]. Patient understands discharge instructions? Yes Patient understands medications and regiment? Yes Patient understands to bring all medications to this visit? Yes  Spoke with patients wife and let her know to call back if they had any questions. She verbalized understanding and confirmed upcoming appointment.

## 2017-01-27 NOTE — H&P (Signed)
Sean [name] is an 76 y.o. male.   Chief Complaint: Chest pain HPI: The patient with past medical history of CAD status post PCI to his RCA presents emergency department complaining of chest pain. The patient states that his pain began at rest while working on his computer. It was substernal and nonradiating. Pain level VIII out of 10 in severity. He denies nausea, vomiting or diaphoresis. Later, the patient states that his left hand began to ache. Occasionally he has sensations of shortness of breath but not at present. The patient has had episodes such as this on multiple occasions but his pain lasted longer than usual tonight. Laboratory evaluation in the emergency department was unremarkable and the patient had no EKG changes. However due to his cardiac history and risk factors emergency department staff called the hospitalist service for admission.  Past Medical History:  Diagnosis Date  . Chest pain   . Coronary artery disease 01/2006   STATUS POST PERCUTANEOUS CORONARY INTERVENTION TO THE RIGHT CORONARY ARTERY  . Depression   . Diarrhea   . Diverticulitis   . GERD (gastroesophageal reflux disease)   . Hypercholesterolemia   . Hypertension   . Nausea & vomiting   . Obesity   . TIA (transient ischemic attack) 2009    Past Surgical History:  Procedure Laterality Date  . APPENDECTOMY    . CARDIAC CATHETERIZATION    . LUMBAR FUSION      Family History  Problem Relation Age of Onset  . Hypertension Father   . Diabetes Mellitus II Father   . CAD Father   . Kidney failure Father   . CAD Sister    Social History:  reports that he quit smoking about 35 years ago. His smoking use included Cigarettes. He has a 17.00 pack-year smoking history. He has never used smokeless tobacco. He reports that he drinks alcohol. He reports that he does not use drugs.  Allergies:  Allergies  Allergen Reactions  . Ciprofloxacin     Medications Prior to Admission  Medication Sig  Dispense Refill  . aspirin EC 81 MG tablet Take 81 mg by mouth daily.    . clopidogrel (PLAVIX) 75 MG tablet TAKE 1 TABLET BY MOUTH DAILY 90 tablet 2  . fenofibrate 160 MG tablet TAKE 1 TABLET BY MOUTH DAILY 30 tablet 3  . metoprolol tartrate (LOPRESSOR) 25 MG tablet TAKE 1 TABLET BY MOUTH DAILY 180 tablet 3  . Multiple Vitamin (MULTIVITAMIN) tablet Take 1 tablet by mouth daily.      . nitroGLYCERIN (NITROSTAT) 0.4 MG SL tablet Place 1 tablet (0.4 mg total) under the tongue every 5 (five) minutes as needed. 25 tablet 1  . pantoprazole (PROTONIX) 40 MG tablet TAKE 1 TABLET BY MOUTH DAILY 90 tablet 2  . PARoxetine (PAXIL) 10 MG tablet Take 5 mg by mouth every morning.      . simvastatin (ZOCOR) 80 MG tablet TAKE ONE TABLET BY MOUTH EVERY NIGHT AT BEDTIME 90 tablet 3  . tamsulosin (FLOMAX) 0.4 MG CAPS capsule Take 0.4 mg by mouth daily.    . traZODone (DESYREL) 50 MG tablet Take 50 mg by mouth at bedtime as needed for sleep.    Marland Kitchen GLUCOSAMINE-CHONDROITIN PO Take by mouth daily. 2 CAPS DAILY     . valsartan (DIOVAN) 80 MG tablet Take 1 tablet (80 mg total) by mouth daily. (Patient not taking: Reported on 01/26/2017) 30 tablet 3    Results for orders placed or performed during the hospital encounter  of 01/26/17 (from the past 48 hour(s))  Basic metabolic panel     Status: Abnormal   Collection Time: 01/26/17  9:43 PM  Result Value Ref Range   Sodium 140 135 - 145 mmol/L   Potassium 3.8 3.5 - 5.1 mmol/L   Chloride 104 101 - 111 mmol/L   CO2 29 22 - 32 mmol/L   Glucose, Bld 140 (H) 65 - 99 mg/dL   BUN 18 6 - 20 mg/dL   Creatinine, Ser 1.23 0.61 - 1.24 mg/dL   Calcium 9.6 8.9 - 10.3 mg/dL   GFR calc non Af Amer 56 (L) >60 mL/min   GFR calc Af Amer >60 >60 mL/min    Comment: (NOTE) The eGFR has been calculated using the CKD EPI equation. This calculation has not been validated in all clinical situations. eGFR's persistently <60 mL/min signify possible Chronic Kidney Disease.    Anion gap 7 5  - 15  CBC     Status: None   Collection Time: 01/26/17  9:43 PM  Result Value Ref Range   WBC 8.6 3.8 - 10.6 K/uL   RBC 4.97 4.40 - 5.90 MIL/uL   Hemoglobin 15.6 13.0 - 18.0 g/dL   HCT 46.2 40.0 - 52.0 %   MCV 92.9 80.0 - 100.0 fL   MCH 31.3 26.0 - 34.0 pg   MCHC 33.7 32.0 - 36.0 g/dL   RDW 13.2 11.5 - 14.5 %   Platelets 275 150 - 440 K/uL  Troponin I     Status: None   Collection Time: 01/26/17  9:43 PM  Result Value Ref Range   Troponin I <0.03 <0.03 ng/mL  TSH     Status: None   Collection Time: 01/26/17  9:43 PM  Result Value Ref Range   TSH 2.333 0.350 - 4.500 uIU/mL    Comment: Performed by a 3rd Generation assay with a functional sensitivity of <=0.01 uIU/mL.  Fibrin derivatives D-Dimer     Status: None   Collection Time: 01/27/17  1:01 AM  Result Value Ref Range   Fibrin derivatives D-dimer ([facility]) 480.82 0 - 499    Comment: (NOTE) <> Exclusion of Venous Thromboembolism (VTE) - OUTPATIENT ONLY   (Emergency Department or [place])   0-499 ng/ml (FEU): With a low to intermediate pretest probability                      for VTE this test result excludes the diagnosis                      of VTE.   >499 ng/ml (FEU) : VTE not excluded; additional work up for VTE is                      required. <> Testing on Inpatients and Evaluation of Disseminated Intravascular   Coagulation ([facility]IC) Reference Range:   0-499 ng/ml (FEU)   Troponin I     Status: None   Collection Time: 01/27/17  1:02 AM  Result Value Ref Range   Troponin I <0.03 <0.03 ng/mL   Dg Chest 2 View  Result Date: 01/26/2017 CLINICAL DATA:  Subacute onset of intermittent midsternal chest pain and left hand numbness. Initial encounter. EXAM: CHEST  2 VIEW COMPARISON:  Chest radiograph performed 04/13/2016 FINDINGS: The lungs are hypoexpanded. Peribronchial thickening is noted. Mild vascular crowding is seen. No pleural effusion or pneumothorax identified. The heart is normal in size. No acute osseous abnormalities  are seen. IMPRESSION: Lungs hypoexpanded.  Peribronchial thickening noted. Electronically Signed   By: Garald Balding M.D.   On: 01/26/2017 22:08    Review of Systems  Constitutional: Negative for chills and fever.  HENT: Negative for sore throat and tinnitus.   Eyes: Negative for blurred vision and redness.  Respiratory: Negative for cough and shortness of breath.   Cardiovascular: Positive for chest pain. Negative for palpitations, orthopnea and PND.  Gastrointestinal: Negative for abdominal pain, diarrhea, nausea and vomiting.  Genitourinary: Negative for dysuria, frequency and urgency.  Musculoskeletal: Negative for joint pain and myalgias.  Skin: Negative for rash.       No lesions  Neurological: Negative for speech change, focal weakness and weakness.  Endo/Heme/Allergies: Does not bruise/bleed easily.       No temperature intolerance  Psychiatric/Behavioral: Negative for depression and suicidal ideas.    Blood pressure (!) 142/69, pulse 68, temperature 98 F (36.7 C), temperature source Oral, resp. rate 18, height _0  (1.753 m), weight 88.5 kg (195 lb), SpO2 94 %. Physical Exam  Constitutional: He is oriented to person, place, and time. He appears well-developed and well-nourished. No distress.  HENT:  Head: Normocephalic and atraumatic.  Mouth/Throat: Oropharynx is clear and moist.  Eyes: Conjunctivae and EOM are normal. Pupils are equal, round, and reactive to light. No scleral icterus.  Neck: Normal range of motion. Neck supple. No JVD present. No tracheal deviation present. No thyromegaly present.  Cardiovascular: Normal rate and regular rhythm.  Exam reveals distant heart sounds. Exam reveals no gallop and no friction rub.   No murmur heard. Respiratory: Effort normal and breath sounds normal. No respiratory distress.  GI: Soft. Bowel sounds are normal. He exhibits no distension. There is no tenderness.  Genitourinary:  Genitourinary Comments: Deferred   Musculoskeletal: Normal range of motion. He exhibits no edema.  Lymphadenopathy:    He has no cervical adenopathy.  Neurological: He is alert and oriented to person, place, and time. No cranial nerve deficit.  Skin: Skin is warm and dry. No rash noted. No erythema.  Psychiatric: He has a normal mood and affect. His behavior is normal. Judgment and thought content normal.     Assessment/Plan This is a 76 year old male admitted for chest pain. 1. Chest pain: Unstable angina. Continue to follow troponin and monitor telemetry. No indication of ischemia at this time. Consult cardiology. The patient is nothing by mouth for possible stress test. 2. Coronary artery disease: Continue aspirin and Plavix 3. Hypertension: Labile; continue metoprolol 4. BPH: Continue Flomax 5. Dyslipidemia: Continue fenofibrate and statin therapy 6. Depression: Continue Paxil and trazodone 7. DVT prophylaxis: Lovenox 8. GI prophylaxis: PPI per home regimen The patient is a full code. Time spent on admission orders and patient care approximately 45 minutes   Harrie Foreman, MD 01/27/2017, 7:57 AM

## 2017-01-27 NOTE — Telephone Encounter (Signed)
°  [facility] ph [facility] for chest pain  Needs 2 wk fu   Scheduled : 02/10/17 at 11 am Gollan

## 2017-01-27 NOTE — Care Management Obs Status (Signed)
MEDICARE OBSERVATION STATUS NOTIFICATION   Patient Details  Name: Sean ParisianMichael Frederick Whittingham MRN: [id number] Date of Birth: [birth year]   Medicare Observation Status Notification Given:  Yes    Eber HongGreene, Bensyn Bornemann R, RN 01/27/2017, 11:47 AM

## 2017-01-27 NOTE — Discharge Summary (Signed)
[employer] - [place] at [facility]   PATIENT NAME: Sean [name]    MR#:  [id number]  DATE OF BIRTH:  January 26, 1941  DATE OF ADMISSION:  01/26/2017   ADMITTING PHYSICIAN: Arnaldo Natal, MD  DATE OF DISCHARGE: 01/27/2017 PRIMARY CARE PHYSICIAN: [name], MD   ADMISSION DIAGNOSIS:  Chest pain, unspecified type [R07.9] DISCHARGE DIAGNOSIS:  Principal Problem:   Chest pain with moderate risk for cardiac etiology Active Problems:   GERD (gastroesophageal reflux disease)   Coronary artery disease   Hypertension   Hypercholesterolemia  SECONDARY DIAGNOSIS:   Past Medical History:  Diagnosis Date  . Chest pain   . Coronary artery disease 01/2006   STATUS POST PERCUTANEOUS CORONARY INTERVENTION TO THE RIGHT CORONARY ARTERY  . Depression   . Diarrhea   . Diverticulitis   . GERD (gastroesophageal reflux disease)   . Hypercholesterolemia   . Hypertension   . Nausea & vomiting   . Obesity   . TIA (transient ischemic attack) 2009   HOSPITAL COURSE:   This is a 76 year old male admitted for chest pain. 1. Chest pain: Atypical.  normal troponin and stress test.  2. Coronary artery disease: Continue aspirin and Plavix 3. Hypertension: Labile; continue metoprolol 4. BPH: Continue Flomax 5. Dyslipidemia: Continue fenofibrate and statin therapy  Discussed with Dr. Mariah Milling. DISCHARGE CONDITIONS:  Stable, discharge to home today. CONSULTS OBTAINED:  Treatment Team:  Antonieta Iba, MD DRUG ALLERGIES:   Allergies  Allergen Reactions  . Ciprofloxacin    DISCHARGE MEDICATIONS:   Allergies as of 01/27/2017      Reactions   Ciprofloxacin       Medication List    STOP taking these medications   valsartan 80 MG tablet Commonly known as:  DIOVAN     TAKE these medications   aspirin EC 81 MG tablet Take 81 mg by mouth daily.   clopidogrel 75 MG tablet Commonly known as:  PLAVIX TAKE 1 TABLET BY MOUTH DAILY   fenofibrate 160 MG  tablet TAKE 1 TABLET BY MOUTH DAILY   GLUCOSAMINE-CHONDROITIN PO Take by mouth daily. 2 CAPS DAILY   metoprolol tartrate 25 MG tablet Commonly known as:  LOPRESSOR TAKE 1 TABLET BY MOUTH DAILY   multivitamin tablet Take 1 tablet by mouth daily.   nitroGLYCERIN 0.4 MG SL tablet Commonly known as:  NITROSTAT Place 1 tablet (0.4 mg total) under the tongue every 5 (five) minutes as needed.   pantoprazole 40 MG tablet Commonly known as:  PROTONIX TAKE 1 TABLET BY MOUTH DAILY   PARoxetine 10 MG tablet Commonly known as:  PAXIL Take 5 mg by mouth every morning.   simvastatin 80 MG tablet Commonly known as:  ZOCOR TAKE ONE TABLET BY MOUTH EVERY NIGHT AT BEDTIME   tamsulosin 0.4 MG Caps capsule Commonly known as:  FLOMAX Take 0.4 mg by mouth daily.   traZODone 50 MG tablet Commonly known as:  DESYREL Take 50 mg by mouth at bedtime as needed for sleep.        DISCHARGE INSTRUCTIONS:  See AVS.  If you experience worsening of your admission symptoms, develop shortness of breath, life threatening emergency, suicidal or homicidal thoughts you must seek medical attention immediately by calling 911 or calling your MD immediately  if symptoms less severe.  You Must read complete instructions/literature along with all the possible adverse reactions/side effects for all the Medicines you take and that have been prescribed to you. Take any new Medicines after you have  completely understood and accpet all the possible adverse reactions/side effects.   Please note  You were cared for by a hospitalist during your hospital stay. If you have any questions about your discharge medications or the care you received while you were in the hospital after you are discharged, you can call the unit and asked to speak with the hospitalist on call if the hospitalist that took care of you is not available. Once you are discharged, your primary care physician will handle any further medical issues.  Please note that NO REFILLS for any discharge medications will be authorized once you are discharged, as it is imperative that you return to your primary care physician (or establish a relationship with a primary care physician if you do not have one) for your aftercare needs so that they can reassess your need for medications and monitor your lab values.    On the day of Discharge:  VITAL SIGNS:  Blood pressure 136/70, pulse (!) 58, temperature 98.1 F (36.7 C), temperature source Oral, resp. rate 18, height 5\' 9"  (1.753 m), weight 195 lb (88.5 kg), SpO2 95 %. PHYSICAL EXAMINATION:  GENERAL:  76 y.o.-year-old patient lying in the bed with no acute distress.  EYES: Pupils equal, round, reactive to light and accommodation. No scleral icterus. Extraocular muscles intact.  HEENT: Head atraumatic, normocephalic. Oropharynx and nasopharynx clear.  NECK:  Supple, no jugular venous distention. No thyroid enlargement, no tenderness.  LUNGS: Normal breath sounds bilaterally, no wheezing, rales,rhonchi or crepitation. No use of accessory muscles of respiration.  CARDIOVASCULAR: S1, S2 normal. No murmurs, rubs, or gallops.  ABDOMEN: Soft, non-tender, non-distended. Bowel sounds present. No organomegaly or mass.  EXTREMITIES: No pedal edema, cyanosis, or clubbing.  NEUROLOGIC: Cranial nerves II through XII are intact. Muscle strength 5/5 in all extremities. Sensation intact. Gait not checked.  PSYCHIATRIC: The patient is alert and oriented x 3.  SKIN: No obvious rash, lesion, or ulcer.  DATA REVIEW:   CBC  Recent Labs Lab 01/26/17 2143  WBC 8.6  HGB 15.6  HCT 46.2  PLT 275    Chemistries   Recent Labs Lab 01/26/17 2143  NA 140  K 3.8  CL 104  CO2 29  GLUCOSE 140*  BUN 18  CREATININE 1.23  CALCIUM 9.6     Microbiology Results  Results for orders placed or performed during the hospital encounter of 04/13/16  Blood culture (routine x 2)     Status: None   Collection Time:  04/13/16 10:27 PM  Result Value Ref Range Status   Specimen Description BLOOD LEFT ANTECUBITAL  Final   Special Requests BOTTLES DRAWN AEROBIC AND ANAEROBIC 10ML  Final   Culture NO GROWTH 5 DAYS  Final   Report Status 04/18/2016 FINAL  Final  Blood culture (routine x 2)     Status: None   Collection Time: 04/13/16 10:27 PM  Result Value Ref Range Status   Specimen Description BLOOD RIGHT ANTECUBITAL  Final   Special Requests BOTTLES DRAWN AEROBIC AND ANAEROBIC 10ML  Final   Culture NO GROWTH 5 DAYS  Final   Report Status 04/18/2016 FINAL  Final  Rapid Influenza A&B Antigens ([facility] only)     Status: None   Collection Time: 04/13/16 10:27 PM  Result Value Ref Range Status   Influenza A ([facility]) NEGATIVE NEGATIVE Final   Influenza B ([facility]) NEGATIVE NEGATIVE Final    RADIOLOGY:  Dg Chest 2 View  Result Date: 01/26/2017 CLINICAL DATA:  Subacute onset of intermittent  midsternal chest pain and left hand numbness. Initial encounter. EXAM: CHEST  2 VIEW COMPARISON:  Chest radiograph performed 04/13/2016 FINDINGS: The lungs are hypoexpanded. Peribronchial thickening is noted. Mild vascular crowding is seen. No pleural effusion or pneumothorax identified. The heart is normal in size. No acute osseous abnormalities are seen. IMPRESSION: Lungs hypoexpanded.  Peribronchial thickening noted. Electronically Signed   By: Roanna Raider M.D.   On: 01/26/2017 22:08   Nm Myocar Multi W/spect W/wall Motion / Ef  Result Date: 01/27/2017 Pharmacological myocardial perfusion imaging study with no significant  Ischemia There is a very small mild region of fixed perfusion defect in the apical region consistent with attenuation artifact. Normal perfusion in this area on nonattenuation corrected images. Normal wall motion, EF estimated at 63% No EKG changes concerning for ischemia at peak stress or in recovery. Low risk scan Signed, Dossie Arbour, MD, Ph.D [facility]     Management plans discussed with the  patient, family and they are in agreement.  CODE STATUS: Full Code   TOTAL TIME TAKING CARE OF THIS PATIENT: 35 minutes.    Shaune Pollack M.D on 01/27/2017 at 4:02 PM  Between 7am to 6pm - Pager - [phone]  After 6pm go to [email or web address] - Social research officer, government  [facility]  Office  [phone]  CC: Primary care physician; [name], MD   Note: This dictation was prepared with Dragon dictation along with smaller phrase technology. Any transcriptional errors that result from this process are unintentional.

## 2017-01-27 NOTE — Progress Notes (Signed)
CH received an OR for AD for the Pt. CH visited the Pt, but the Pt was not in the Rm. CH talked to the Pt's family member who told CH that the Pt was taken out to do a stress test. CH told the Pt's family member that he would make a follow up visit with the Pt later.    01/27/17 1600  Clinical Encounter Type  Visited With Family  Visit Type Initial;ED  Referral From Nurse  Consult/Referral To Chaplain  Spiritual Encounters  Spiritual Needs Literature;Brochure

## 2017-01-28 LAB — HEMOGLOBIN A1C
Hgb A1c MFr Bld: 6.3 % — ABNORMAL HIGH (ref 4.8–5.6)
Mean Plasma Glucose: 134 mg/dL

## 2017-02-01 ENCOUNTER — Other Ambulatory Visit: Payer: Self-pay | Admitting: Cardiovascular Disease

## 2017-02-09 NOTE — Progress Notes (Signed)
Cardiology Office Note  Date:  02/10/2017   ID:  Sean [name], DOB June 14, 1941, MRN [id number]  PCP:  [name], MD   Chief Complaint  Patient presents with  . other    Follow up from [facility] ER; had Myoview due to chest pain. "doing well."     HPI:  76 year old gentleman with history of coronary artery disease, stent to his RCA in 2007, normal stress test in March 2011, chest pain earlier in 2013 with repeat stress testing showing no significant ischemia who presents to the office for routine followup  Of his coronary artery disease  Recent episode chest pain with hospitalization he has a full-time job in the [employer] .   he is currently remodeling cars  Hospital admission 01/27/2017 for chest pain Hospital records reviewed Cardiac enzymes negative, no EKG changes stress test 01/27/2017 showing no ischemia ,  normal ejection fraction 63% Discharged with follow-up in clinic today  Still with chest pain at rest,  Daily or every other Sometimes short other times long/hours Several hr this AM at rest, no shortness of breath   Unclear if chest pain symptoms are secondary to GI related He is been taking Protonix daily Has not tried Tums, Pepcid or higher dose PPI for symptom relief  No regular exercise but reports that he is busy fixing old cars He is taking simvastatin on a regular basis.   blood pressure well controlled at home, weight stable  No EKG performed on today's visit, hospital EKG personally reviewed by myself showing normal sinus rhythm without significant ST-T wave changes  Other past medical history reviewed Recent episode of chills, fevers, high temperatures 04/13/2016, blood cultures negative Hospital records reviewed. Treated with doxycycline per the patient with improvement of his symptoms  Review of lab work shows hemoglobin A1c 6.5 down to 6.1 Total cholesterol 151, LDL 93    PMH:   has a past medical history of Chest pain;  Coronary artery disease (01/2006); Depression; Diarrhea; Diverticulitis; GERD (gastroesophageal reflux disease); Hypercholesterolemia; Hypertension; Nausea & vomiting; Obesity; and TIA (transient ischemic attack) (2009).  PSH:    Past Surgical History:  Procedure Laterality Date  . APPENDECTOMY    . CARDIAC CATHETERIZATION    . LUMBAR FUSION      Current Outpatient Prescriptions  Medication Sig Dispense Refill  . aspirin EC 81 MG tablet Take 81 mg by mouth daily.    . clopidogrel (PLAVIX) 75 MG tablet TAKE 1 TABLET BY MOUTH DAILY 90 tablet 2  . fenofibrate 160 MG tablet TAKE 1 TABLET BY MOUTH DAILY 30 tablet 0  . GLUCOSAMINE-CHONDROITIN PO Take by mouth daily. 2 CAPS DAILY     . metoprolol tartrate (LOPRESSOR) 25 MG tablet TAKE 1 TABLET BY MOUTH DAILY 180 tablet 3  . Multiple Vitamin (MULTIVITAMIN) tablet Take 1 tablet by mouth daily.      . nitroGLYCERIN (NITROSTAT) 0.4 MG SL tablet Place 1 tablet (0.4 mg total) under the tongue every 5 (five) minutes as needed. 25 tablet 1  . pantoprazole (PROTONIX) 40 MG tablet TAKE 1 TABLET BY MOUTH DAILY 90 tablet 2  . PARoxetine (PAXIL) 10 MG tablet Take 5 mg by mouth every morning.      . simvastatin (ZOCOR) 80 MG tablet TAKE ONE TABLET BY MOUTH EVERY NIGHT AT BEDTIME 90 tablet 3  . tamsulosin (FLOMAX) 0.4 MG CAPS capsule Take 0.4 mg by mouth daily.    . traZODone (DESYREL) 50 MG tablet Take 50 mg by mouth at  bedtime as needed for sleep.     No current facility-administered medications for this visit.      Allergies:   Ciprofloxacin   Social History:  The patient  reports that he quit smoking about 35 years ago. His smoking use included Cigarettes. He has a 17.00 pack-year smoking history. He has never used smokeless tobacco. He reports that he drinks alcohol. He reports that he does not use drugs.   Family History:   family history includes CAD in his father and sister; Diabetes Mellitus II in his father; Hypertension in his father; Kidney  failure in his father.    Review of Systems: Review of Systems  Constitutional: Negative.   Respiratory: Negative.   Cardiovascular: Positive for chest pain.  Gastrointestinal: Negative.   Musculoskeletal: Negative.   Neurological: Negative.   Psychiatric/Behavioral: Negative.   All other systems reviewed and are negative.    PHYSICAL EXAM: VS:  BP 130/82 (BP Location: Left Arm, Patient Position: Sitting, Cuff Size: Normal)   Pulse 72   Ht 5\' 9"  (1.753 m)   Wt 226 lb 4 oz (102.6 kg)   BMI 33.41 kg/m  , BMI Body mass index is 33.41 kg/m. GEN: Well nourished, well developed, in no acute distress, obese  HEENT: normal  Neck: no JVD, carotid bruits, or masses Cardiac: RRR; no murmurs, rubs, or gallops,no edema  Respiratory:  clear to auscultation bilaterally, normal work of breathing GI: soft, nontender, nondistended, + BS MS: no deformity or atrophy  Skin: warm and dry, no rash Neuro:  Strength and sensation are intact Psych: euthymic mood, full affect    Recent Labs: 01/26/2017: BUN 18; Creatinine, Ser 1.23; Hemoglobin 15.6; Platelets 275; Potassium 3.8; Sodium 140; TSH 2.333    Lipid Panel Lab Results  Component Value Date   CHOL 156 01/27/2017   HDL 28 (L) 01/27/2017   LDLCALC 81 01/27/2017   TRIG 237 (H) 01/27/2017      Wt Readings from Last 3 Encounters:  02/10/17 226 lb 4 oz (102.6 kg)  01/26/17 195 lb (88.5 kg)  04/24/16 210 lb 12 oz (95.6 kg)       ASSESSMENT AND PLAN:  Angina pectoris (HCC) Long discussion concerning anginal symptoms  he had nausea vomiting prior to stenting 10 years ago Recent negative stress test  Coronary artery disease of native artery of native heart with stable angina pectoris (HCC) Recent chest pain symptoms Negative stress test for ischemia  Essential hypertension Blood pressure is well controlled on today's visit. No changes made to the medications.  Hypercholesterolemia Cholesterol is at goal on the current  lipid regimen. No changes to the medications were made.  Chest pain with moderate risk for cardiac etiology Etiology of his chest pain is unclear, atypical in nature Presenting at rest, no exacerbation with activity   recommended he try doubling his proton pump inhibitor Suggested he might Tums, Pepcid and take these as needed for breakthrough chest discomfort We have provided a phone number for GI consultation if there is improvement in his symptoms with the above. We will recommend close follow-up in clinic, if no improvement of symptoms additional workup may be needed Discussed the difference between musculoskeletal symptoms and anginal symptoms   Total encounter time more than 25 minutes  Greater than 50% was spent in counseling and coordination of care with the patient   No orders of the defined types were placed in this encounter.    Signed, Dossie Arbour, M.D., Ph.D. 02/10/2017  [facility]  [place], [place]

## 2017-02-10 ENCOUNTER — Ambulatory Visit (INDEPENDENT_AMBULATORY_CARE_PROVIDER_SITE_OTHER): Payer: Medicare Other | Admitting: Cardiovascular Disease

## 2017-02-10 ENCOUNTER — Encounter: Payer: Self-pay | Admitting: Cardiovascular Disease

## 2017-02-10 VITALS — BP 130/82 | HR 72 | Ht 69.0 in | Wt 226.2 lb

## 2017-02-10 DIAGNOSIS — E7800 Pure hypercholesterolemia, unspecified: Secondary | ICD-10-CM

## 2017-02-10 DIAGNOSIS — I10 Essential (primary) hypertension: Secondary | ICD-10-CM

## 2017-02-10 DIAGNOSIS — I25118 Atherosclerotic heart disease of native coronary artery with other forms of angina pectoris: Secondary | ICD-10-CM

## 2017-02-10 DIAGNOSIS — R079 Chest pain, unspecified: Secondary | ICD-10-CM

## 2017-02-10 DIAGNOSIS — I209 Angina pectoris, unspecified: Secondary | ICD-10-CM | POA: Diagnosis not present

## 2017-02-10 NOTE — Patient Instructions (Addendum)
Medication Instructions:   No medication changes made  Labwork:  No new labs needed  Testing/Procedures:  No further testing at this time   I recommend watching educational videos on topics of interest to you at:       [email or web address]  Enter code: HEARTCARE    Follow-Up: It was a pleasure seeing you in the office today. Please call us if you have new issues that need to be addressed before your next appt.  [phone]  Your physician wants you to follow-up in: 1 month.    If you need a refill on your cardiac medications before your next appointment, please call your pharmacy.     

## 2017-03-08 ENCOUNTER — Other Ambulatory Visit: Payer: Self-pay | Admitting: Cardiovascular Disease

## 2017-03-12 NOTE — Progress Notes (Signed)
Cardiology Office Note  Date:  03/15/2017   ID:  Sean [name], Sean [name] [birth year], MRN [id number]  PCP:  [name], MD   Chief Complaint  Patient presents with  . other    76yo f/u. Pt c/o chest pain at times-pt states TUMS do not help;does not want EKG. Reviewed meds with pt verbally.    HPI:  76 year old gentleman with history of  coronary artery disease,  stent to his RCA in 2007,  normal stress test in March 2011,  chest pain earlier in 2013 with repeat stress testing showing no significant ischemia  Hospital admission 01/27/2017 for chest pain stress test 01/27/2017 showing no ischemia ,  normal ejection fraction 63% who presents to the office for routine followup  Of his coronary artery disease    Worked on car on the ground, Severe pain in chest Pain went away After working on the car, twisting and turning on his back Feels it was musculoskeletal Has not had any pain since then  he had a full-time job in the [employer] operational [employer] .   he is currently remodeling cars  no shortness of breath   No regular exercise but reports that he is busy fixing old cars He is taking simvastatin on a regular basis.   blood pressure well controlled at home, weight stable  No EKG performed on today's visit  Other past medical history reviewed Recent episode of chills, fevers, high temperatures 04/13/2016, blood cultures negative Hospital records reviewed. Treated with doxycycline per the patient with improvement of his symptoms  Review of lab work shows hemoglobin A1c 6.5 down to 6.1 Total cholesterol 151, LDL 93    PMH:   has a past medical history of Chest pain; Coronary artery disease (01/2006); Depression; Diarrhea; Diverticulitis; GERD (gastroesophageal reflux disease); Hypercholesterolemia; Hypertension; Nausea & vomiting; Obesity; and TIA (transient ischemic attack) (2009).  PSH:    Past Surgical History:  Procedure Laterality Date  . APPENDECTOMY    .  CARDIAC CATHETERIZATION    . LUMBAR FUSION      Current Outpatient Prescriptions  Medication Sig Dispense Refill  . aspirin EC 81 MG tablet Take 81 mg by mouth daily.    . clopidogrel (PLAVIX) 75 MG tablet TAKE 1 TABLET BY MOUTH DAILY 90 tablet 2  . fenofibrate 160 MG tablet TAKE 1 TABLET BY MOUTH DAILY 30 tablet 3  . GLUCOSAMINE-CHONDROITIN PO Take by mouth daily. 2 CAPS DAILY     . metoprolol tartrate (LOPRESSOR) 25 MG tablet TAKE 1 TABLET BY MOUTH DAILY 180 tablet 3  . Multiple Vitamin (MULTIVITAMIN) tablet Take 1 tablet by mouth daily.      . nitroGLYCERIN (NITROSTAT) 0.4 MG SL tablet Place 1 tablet (0.4 mg total) under the tongue every 5 (five) minutes as needed. 25 tablet 1  . pantoprazole (PROTONIX) 40 MG tablet TAKE 1 TABLET BY MOUTH DAILY 90 tablet 2  . PARoxetine (PAXIL) 10 MG tablet Take 5 mg by mouth every morning.      . simvastatin (ZOCOR) 80 MG tablet TAKE ONE TABLET BY MOUTH EVERY NIGHT AT BEDTIME 90 tablet 3  . tamsulosin (FLOMAX) 0.4 MG CAPS capsule Take 0.4 mg by mouth daily.  10  . ezetimibe (ZETIA) 10 MG tablet Take 1 tablet (10 mg total) by mouth daily. 90 tablet 3   No current facility-administered medications for this visit.      Allergies:   Ciprofloxacin   Social History:  The patient  reports that he quit smoking about  36 years ago. His smoking use included Cigarettes. He has a 17.00 pack-year smoking history. He has never used smokeless tobacco. He reports that he drinks alcohol. He reports that he does not use drugs.   Family History:   family history includes CAD in his father and sister; Diabetes Mellitus II in his father; Hypertension in his father; Kidney failure in his father.    Review of Systems: Review of Systems  Constitutional: Negative.   Respiratory: Negative.   Cardiovascular: Negative.   Gastrointestinal: Negative.   Musculoskeletal: Negative.   Neurological: Negative.   Psychiatric/Behavioral: Negative.   All other systems reviewed  and are negative.    PHYSICAL EXAM: VS:  BP 126/90 (BP Location: Left Arm, Patient Position: Sitting, Cuff Size: Normal)   Pulse 68   Ht 5' 8.5" (1.74 m)   Wt 227 lb 8 oz (103.2 kg)   BMI 34.09 kg/m  , BMI Body mass index is 34.09 kg/m. GEN: Well nourished, well developed, in no acute distress, obese  HEENT: normal  Neck: no JVD, carotid bruits, or masses Cardiac: RRR; no murmurs, rubs, or gallops,no edema  Respiratory:  clear to auscultation bilaterally, normal work of breathing GI: soft, nontender, nondistended, + BS MS: no deformity or atrophy  Skin: warm and dry, no rash Neuro:  Strength and sensation are intact Psych: euthymic mood, full affect    Recent Labs: 01/26/2017: BUN 18; Creatinine, Ser 1.23; Hemoglobin 15.6; Platelets 275; Potassium 3.8; Sodium 140; TSH 2.333    Lipid Panel Lab Results  Component Value Date   CHOL 156 01/27/2017   HDL 28 (L) 01/27/2017   LDLCALC 81 01/27/2017   TRIG 237 (H) 01/27/2017      Wt Readings from Last 3 Encounters:  03/15/17 227 lb 8 oz (103.2 kg)  02/10/17 226 lb 4 oz (102.6 kg)  01/26/17 195 lb (88.5 kg)       ASSESSMENT AND PLAN:   Angina pectoris (HCC) No angina since his pain went away while working underneath a car Likely musculoskeletal in nature  Coronary artery disease of native artery of native heart with stable angina pectoris (HCC) Negative stress test for ischemia  Essential hypertension Blood pressure is well controlled on today's visit. No changes made to the medications. Monitor diastolic pressure at home  Hypercholesterolemia Cholesterol is at goal on the current lipid regimen. No changes to the medications were made.    Total encounter time more than 15 minutes  Greater than 50% was spent in counseling and coordination of care with the patient   No orders of the defined types were placed in this encounter.    Signed, Dossie Arbour, M.D., Ph.D. 03/15/2017  [facility]  [place], [place] [phone]

## 2017-03-15 ENCOUNTER — Encounter: Payer: Self-pay | Admitting: Cardiovascular Disease

## 2017-03-15 ENCOUNTER — Ambulatory Visit (INDEPENDENT_AMBULATORY_CARE_PROVIDER_SITE_OTHER): Payer: Medicare Other | Admitting: Cardiovascular Disease

## 2017-03-15 VITALS — BP 126/90 | HR 68 | Ht 68.5 in | Wt 227.5 lb

## 2017-03-15 DIAGNOSIS — I209 Angina pectoris, unspecified: Secondary | ICD-10-CM | POA: Diagnosis not present

## 2017-03-15 DIAGNOSIS — I25118 Atherosclerotic heart disease of native coronary artery with other forms of angina pectoris: Secondary | ICD-10-CM | POA: Diagnosis not present

## 2017-03-15 DIAGNOSIS — E7800 Pure hypercholesterolemia, unspecified: Secondary | ICD-10-CM

## 2017-03-15 DIAGNOSIS — K219 Gastro-esophageal reflux disease without esophagitis: Secondary | ICD-10-CM | POA: Diagnosis not present

## 2017-03-15 DIAGNOSIS — I10 Essential (primary) hypertension: Secondary | ICD-10-CM

## 2017-03-15 MED ORDER — EZETIMIBE 10 MG PO TABS
10.0000 mg | ORAL_TABLET | Freq: Every day | ORAL | 3 refills | Status: DC
Start: 2017-03-15 — End: 2018-03-28

## 2017-03-15 NOTE — Patient Instructions (Addendum)
Medication Instructions:   Please start zetia one a day for cholesterol  Labwork:  No new labs needed  Testing/Procedures:  No further testing at this time   I recommend watching educational videos on topics of interest to you at:       [email or web address]  Enter code: HEARTCARE    Follow-Up: It was a pleasure seeing you in the office today. Please call us if you have new issues that need to be addressed before your next appt.  [phone]  Your physician wants you to follow-up in: 12 months.  You will receive a reminder letter in the mail two months in advance. If you don't receive a letter, please call our office to schedule the follow-up appointment.  If you need a refill on your cardiac medications before your next appointment, please call your pharmacy.     

## 2017-05-03 ENCOUNTER — Ambulatory Visit: Payer: Medicare Other | Admitting: Cardiovascular Disease

## 2017-05-06 ENCOUNTER — Other Ambulatory Visit: Payer: Self-pay | Admitting: Cardiovascular Disease

## 2017-07-04 ENCOUNTER — Other Ambulatory Visit: Payer: Self-pay | Admitting: Cardiovascular Disease

## 2017-07-21 ENCOUNTER — Telehealth: Payer: Self-pay | Admitting: Cardiovascular Disease

## 2017-07-21 NOTE — Telephone Encounter (Signed)
Received records request from [facility], forwarded to [facility] for processing.

## 2017-08-05 ENCOUNTER — Other Ambulatory Visit: Payer: Self-pay | Admitting: Cardiovascular Disease

## 2017-08-08 ENCOUNTER — Other Ambulatory Visit: Payer: Self-pay | Admitting: Cardiovascular Disease

## 2017-08-09 ENCOUNTER — Encounter: Payer: Self-pay | Admitting: *Deleted

## 2017-08-09 ENCOUNTER — Encounter: Payer: Medicare Other | Attending: Family Medicine | Admitting: *Deleted

## 2017-08-09 VITALS — BP 126/70 | Ht 69.0 in | Wt 225.5 lb

## 2017-08-09 DIAGNOSIS — E119 Type 2 diabetes mellitus without complications: Secondary | ICD-10-CM

## 2017-08-09 NOTE — Progress Notes (Signed)
Diabetes Self-Management Education  Visit Type: First/Initial  Appt. Start Time: 0905 Appt. End Time: 1020  08/09/2017  [name], identified by name and date of birth, is a 76 y.o. male with a diagnosis of Diabetes: Type 2.   ASSESSMENT  Blood pressure 126/70, height  (1.753 m), weight 225 lb 8 oz (102.3 kg). Body mass index is 33.3 kg/m.      Diabetes Self-Management Education - 08/09/17 1037      Visit Information   Visit Type First/Initial     Initial Visit   Diabetes Type Type 2   Are you currently following a meal plan? No   Are you taking your medications as prescribed? Yes   Date Diagnosed August 2018 - A1C in 2016 was 6.5 %     Health Coping   How would you rate your overall health? Good     Psychosocial Assessment   Patient Belief/Attitude about Diabetes Motivated to manage diabetes  "time to get to work"   Self-care barriers None   Self-management support Doctor's office;Family   Patient Concerns Nutrition/Meal planning;Glycemic Control;Weight Control;Medication   Special Needs None   Preferred Learning Style Hands on;Auditory   Learning Readiness Change in progress   How often do you need to have someone help you when you read instructions, pamphlets, or other written materials from your doctor or pharmacy? 1 - Never   What is the last grade level you completed in school? 14     Pre-Education Assessment   Patient understands the diabetes disease and treatment process. Needs Instruction   Patient understands incorporating nutritional management into lifestyle. Needs Instruction   Patient undertands incorporating physical activity into lifestyle. Needs Instruction   Patient understands using medications safely. Needs Instruction   Patient understands monitoring blood glucose, interpreting and using results Needs Instruction   Patient understands prevention, detection, and treatment of acute complications. Needs Instruction   Patient  understands prevention, detection, and treatment of chronic complications. Needs Instruction   Patient understands how to develop strategies to address psychosocial issues. Needs Instruction   Patient understands how to develop strategies to promote health/change behavior. Needs Instruction     Complications   Last HgB A1C per patient/outside source 6.7 %  07/26/17   How often do you check your blood sugar? 0 times/day (not testing)  Provided Contour Next One meter and instructed on use. BG upon return demonstration was 180 mg/dL at 69:62 am - 2 hrs pp. Pt had muffin and juice for breakfast.    Have you had a dilated eye exam in the past 12 months? No   Have you had a dental exam in the past 12 months? No   Are you checking your feet? No     Dietary Intake   Breakfast muffin sometimes with peanut butter or sausage or cereal and milk sometimes with pineapple   Snack (morning) nabs and Dr Sean [name]   Lunch salads with ham and [place]; meat and vegetables, potato   Dinner chicken mostly with some fish, pork and beef - potatoes, corn, beans, peas, rice pasta   Snack (evening) sometimes popcorn   Beverage(s) water, Dr Sean [name], juice, unsweetened coffe and tea     Exercise   Exercise Type ADL's     Patient Education   Disease state  Definition of diabetes, type 1 and 2, and the diagnosis of diabetes;Factors that contribute to the development of diabetes   Nutrition management  Role of diet in the treatment of diabetes  and the relationship between the three main macronutrients and blood glucose level;Food label reading, portion sizes and measuring food.;Reviewed blood glucose goals for pre and post meals and how to evaluate the patients' food intake on their blood glucose level.   Physical activity and exercise  Role of exercise on diabetes management, blood pressure control and cardiac health.   Monitoring Taught/evaluated SMBG meter.;Purpose and frequency of SMBG.;Taught/discussed recording of test  results and interpretation of SMBG.;Identified appropriate SMBG and/or A1C goals.   Chronic complications Relationship between chronic complications and blood glucose control   Psychosocial adjustment Identified and addressed patients feelings and concerns about diabetes     Individualized Goals (developed by patient)   Reducing Risk Improve blood sugars Decrease medications Lose weight     Outcomes   Expected Outcomes Demonstrated interest in learning. Expect positive outcomes   Future DMSE 2 wks      Individualized Plan for Diabetes Self-Management Training:   Learning Objective:  Patient will have a greater understanding of diabetes self-management. Patient education plan is to attend individual and/or group sessions per assessed needs and concerns.   Plan:   Patient Instructions  Check blood sugars 2 x day before breakfast and 2 hrs after one meal 3-4 x week Bring blood sugar records to the next class Call your doctor for a prescription for:  1. Meter strips (type) Contour Next  checking  3-4 times per week  2. Lancets (type) Contour Microlet checking  3-4  times per week Exercise: Begin walking  for  10-15  minutes   3  days a week and gradually increase to 30 minutes 5 x week Eat 3 meals day, 1-2  snacks a day Space meals 4-6 hours apart Avoid sugar sweetened drinks (soda, juices) Limit desserts/sweets Make an eye doctor appointment   Expected Outcomes:  Demonstrated interest in learning. Expect positive outcomes  Education material provided:  General Meal Planning Guidelines Simple Meal Plan Meter = Contour Next One  If problems or questions, patient to contact team via:  Sharion SettlerSheila Anita Mcadory, RN, CCM, CDE [phone]  Future DSME appointment: 2 wks  August 23, 2017 for Diabetes Class 1

## 2017-08-09 NOTE — Patient Instructions (Signed)
Check blood sugars 2 x day before breakfast and 2 hrs after one meal every day Bring blood sugar records to the next class  Call your doctor for a prescription for:  1. Meter strips (type) Contour Next  checking  3-4 times per week  2. Lancets (type) Contour Microlet checking  3-4  times per week  Exercise: Begin walking  for  10-15  minutes   3  days a week and gradually increase to 30 minutes 5 x week  Eat 3 meals day, 1-2  snacks a day Space meals 4-6 hours apart Avoid sugar sweetened drinks (soda, juices) Limit desserts/sweets  Make an eye doctor appointment  Return for classes on:

## 2017-08-23 ENCOUNTER — Encounter: Payer: Medicare Other | Admitting: Dietician

## 2017-08-23 ENCOUNTER — Encounter: Payer: Self-pay | Admitting: Dietician

## 2017-08-23 VITALS — Ht 69.0 in | Wt 223.8 lb

## 2017-08-23 DIAGNOSIS — E119 Type 2 diabetes mellitus without complications: Secondary | ICD-10-CM

## 2017-08-23 NOTE — Progress Notes (Signed)

## 2017-08-30 ENCOUNTER — Encounter: Payer: Medicare Other | Attending: Family Medicine

## 2017-08-30 ENCOUNTER — Telehealth: Payer: Self-pay | Admitting: Dietician

## 2017-08-30 DIAGNOSIS — E119 Type 2 diabetes mellitus without complications: Secondary | ICD-10-CM | POA: Insufficient documentation

## 2017-08-30 NOTE — Telephone Encounter (Signed)
Pt did not come to class 2 tonight-called pt and he does not want to reschedule and does not want to return

## 2017-09-04 ENCOUNTER — Other Ambulatory Visit: Payer: Self-pay | Admitting: Cardiovascular Disease

## 2017-09-07 ENCOUNTER — Encounter: Payer: Self-pay | Admitting: *Deleted

## 2017-12-06 ENCOUNTER — Other Ambulatory Visit: Payer: Self-pay

## 2017-12-06 MED ORDER — SIMVASTATIN 80 MG PO TABS: 80.0000 mg | ORAL_TABLET | Freq: Every day | ORAL | 0 refills | Status: DC

## 2017-12-06 NOTE — Telephone Encounter (Signed)
Refill request for Simvastatin 80 mg

## 2017-12-07 ENCOUNTER — Other Ambulatory Visit: Payer: Self-pay | Admitting: *Deleted

## 2017-12-07 MED ORDER — SIMVASTATIN 80 MG PO TABS: 80.0000 mg | ORAL_TABLET | Freq: Every day | ORAL | 0 refills | Status: DC

## 2018-01-13 ENCOUNTER — Other Ambulatory Visit: Payer: Self-pay | Admitting: Cardiovascular Disease

## 2018-01-19 ENCOUNTER — Other Ambulatory Visit: Payer: Self-pay | Admitting: Cardiovascular Disease

## 2018-03-28 ENCOUNTER — Other Ambulatory Visit: Payer: Self-pay | Admitting: Cardiovascular Disease

## 2018-04-15 ENCOUNTER — Telehealth: Payer: Self-pay | Admitting: Cardiovascular Disease

## 2018-04-15 NOTE — Telephone Encounter (Signed)
No ans no vm   °

## 2018-04-15 NOTE — Telephone Encounter (Signed)
Pt due for 1 yr f/u needs to schedule future appointment before we can refill.

## 2018-04-15 NOTE — Telephone Encounter (Signed)
°*  STAT* If patient is at the pharmacy, call can be transferred to refill team.   1. Which medications need to be refilled? (please list name of each medication and dose if known)  Ezetimibe   2. Which pharmacy/location (including street and city if local pharmacy) is medication to be sent to? [place] [facility]   3. Do they need a 30 day or 90 day supply? 90 day

## 2018-04-21 ENCOUNTER — Other Ambulatory Visit: Payer: Self-pay

## 2018-04-21 MED ORDER — EZETIMIBE 10 MG PO TABS: 10.0000 mg | ORAL_TABLET | Freq: Every day | ORAL | 0 refills | Status: DC

## 2018-04-21 NOTE — Telephone Encounter (Signed)
Refill sent.   ezetimibe (ZETIA) 10 MG tablet 90 tablet 0 04/21/2018    Sig - Route: Take 1 tablet (10 mg total) by mouth daily. - Oral   Sent to pharmacy as: ezetimibe (ZETIA) 10 MG tablet   Notes to Pharmacy: Patient needs an appointment for further refills, Thanks ! [phone]   E-Prescribing Status: Receipt confirmed by pharmacy (04/21/2018 10:57 AM EDT)   Pharmacy   [facility] - [place] - [address]

## 2018-04-21 NOTE — Telephone Encounter (Signed)
Pt is scheduled for 06/01/18 with Thayer Ohm berge

## 2018-05-14 ENCOUNTER — Other Ambulatory Visit: Payer: Self-pay | Admitting: Cardiovascular Disease

## 2018-06-03 ENCOUNTER — Encounter: Payer: Self-pay | Admitting: Nurse Practitioner

## 2018-06-03 ENCOUNTER — Ambulatory Visit: Payer: Medicare Other | Admitting: Nurse Practitioner

## 2018-06-03 VITALS — BP 90/60 | HR 51 | Ht 69.0 in | Wt 218.0 lb

## 2018-06-03 DIAGNOSIS — I2510 Atherosclerotic heart disease of native coronary artery without angina pectoris: Secondary | ICD-10-CM | POA: Diagnosis not present

## 2018-06-03 DIAGNOSIS — E7800 Pure hypercholesterolemia, unspecified: Secondary | ICD-10-CM | POA: Diagnosis not present

## 2018-06-03 DIAGNOSIS — I10 Essential (primary) hypertension: Secondary | ICD-10-CM

## 2018-06-03 MED ORDER — EZETIMIBE 10 MG PO TABS: 10.0000 mg | ORAL_TABLET | Freq: Every day | ORAL | 3 refills | Status: DC

## 2018-06-03 MED ORDER — FENOFIBRATE 160 MG PO TABS: 160.0000 mg | ORAL_TABLET | Freq: Every day | ORAL | 3 refills | Status: DC

## 2018-06-03 MED ORDER — METOPROLOL TARTRATE 25 MG PO TABS: ORAL_TABLET | ORAL | 3 refills | Status: AC

## 2018-06-03 MED ORDER — CLOPIDOGREL BISULFATE 75 MG PO TABS: 75.0000 mg | ORAL_TABLET | Freq: Every day | ORAL | 3 refills | Status: DC

## 2018-06-03 MED ORDER — SIMVASTATIN 80 MG PO TABS: 80.0000 mg | ORAL_TABLET | Freq: Every day | ORAL | 3 refills | Status: DC

## 2018-06-03 NOTE — Progress Notes (Signed)
Office Visit    Patient Name: Sean [name] Date of Encounter: 06/03/2018  Primary Care Provider:  [name], MD Primary Cardiologist:  Julien Nordmann, MD  Chief Complaint    77 year old male with a history of CAD status post RCA stenting, hypertension, hyperlipidemia, diabetes, obesity, and TIA, who presents for annual follow-up.  Past Medical History    Past Medical History:  Diagnosis Date  . Chest pain   . Coronary artery disease    a. 01/2006 PCI/DES to RCA; b. 01/2010 MV: No ischemia; c. 2013 MV: No ischemia; d. 12/2016 MV: No ischemia, EF 63%.  . Depression   . Diabetes mellitus without complication (HCC)   . Diarrhea   . Diverticulitis   . GERD (gastroesophageal reflux disease)   . Hypercholesterolemia   . Hypertension   . Nausea & vomiting   . Obesity   . TIA (transient ischemic attack) 2009   Past Surgical History:  Procedure Laterality Date  . APPENDECTOMY    . CARDIAC CATHETERIZATION    . LUMBAR FUSION      Allergies  Allergies  Allergen Reactions  . Ciprofloxacin   . Quinolones   . Fibrates Rash    History of Present Illness    77 year old male with the above past medical history including coronary artery disease status post drug-eluting stent placement to the right coronary artery in 2007.  He has had multiple noninvasive stress test over the years with negative Myoview's in 2011, 2013, and most recently in February 2018.  Other history includes hypertension, hyperlipidemia, diabetes, GERD, depression, and TIA.  Over the past year, he has done well.  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  He is able to do this without limitations and denies chest pain, palpitations, dyspnea, PND, orthopnea, dizziness, syncope, edema, or early satiety.  Home Medications    Prior to Admission medications   Medication Sig Start Date End Date Taking? Authorizing Provider  Acetaminophen (TYLENOL PO) Take by mouth as  needed.   Yes [provider]  aspirin EC 81 MG tablet Take 81 mg by mouth daily.   Yes [provider]  clopidogrel (PLAVIX) 75 MG tablet TAKE 1 TABLET BY MOUTH DAILY 08/06/17  Yes Gollan, Tollie Pizza, MD  ezetimibe (ZETIA) 10 MG tablet Take 1 tablet (10 mg total) by mouth daily. 04/21/18  Yes Antonieta Iba, MD  fenofibrate 160 MG tablet TAKE 1 TABLET BY MOUTH ONCE DAILY 01/19/18  Yes Gollan, Tollie Pizza, MD  GLUCOSAMINE-CHONDROITIN PO Take by mouth daily. 2 CAPS DAILY    Yes [provider]  metoprolol tartrate (LOPRESSOR) 25 MG tablet TAKE 1 TABLET BY MOUTH ONCE DAILY 01/13/18  Yes Gollan, Tollie Pizza, MD  Multiple Vitamin (MULTIVITAMIN) tablet Take 1 tablet by mouth daily.     Yes [provider]  nitroGLYCERIN (NITROSTAT) 0.4 MG SL tablet Place 1 tablet (0.4 mg total) under the tongue every 5 (five) minutes as needed. 01/27/17  Yes Gollan, Tollie Pizza, MD  pantoprazole (PROTONIX) 40 MG tablet TAKE 1 TABLET BY MOUTH ONCE DAILY 05/16/18  Yes Gollan, Tollie Pizza, MD  PARoxetine (PAXIL) 10 MG tablet Take 5 mg by mouth every morning.     Yes [provider]  simvastatin (ZOCOR) 80 MG tablet Take 1 tablet (80 mg total) by mouth at bedtime. 12/07/17  Yes Gollan, Tollie Pizza, MD  tamsulosin (FLOMAX) 0.4 MG CAPS capsule Take 0.4 mg by mouth daily. 03/01/17  Yes [provider]    Review of Systems    Overall doing well.  He denies chest pain, palpitations, dyspnea, pnd, orthopnea, n, v, dizziness, syncope, edema, weight gain, or early satiety.  All other systems reviewed and are otherwise negative except as noted above.  Physical Exam    VS:  BP 90/60 (BP Location: Left Arm, Patient Position: Sitting, Cuff Size: Normal)   Pulse (!) 51   Ht 5\' 9"  (1.753 m)   Wt 218 lb (98.9 kg)   BMI 32.19 kg/m  , BMI Body mass index is 32.19 kg/m. GEN: Well nourished, well developed, in no acute distress.  HEENT: normal.  Neck: Supple, no JVD, carotid bruits, or  masses. Cardiac: RRR, no murmurs, rubs, or gallops. No clubbing, cyanosis, edema.  Radials/DP/PT 2+ and equal bilaterally.  Respiratory:  Respirations regular and unlabored, clear to auscultation bilaterally. GI: Soft, nontender, nondistended, BS + x 4. MS: no deformity or atrophy. Skin: warm and dry, no rash. Neuro:  Strength and sensation are intact. Psych: Normal affect.  Accessory Clinical Findings    ECG -sinus bradycardia, 51, PAC, first-degree AV block, no acute ST or T changes.  Assessment & Plan    1.  Coronary artery disease: Status post PCI drug-eluting stent to the RCA in 2007 with several negative stress test, the last of which in February 2018.  He has done well over the past year without any recurrent chest pain or dyspnea and remains relatively active.  Upon reviewing his medications, I note that he is on metoprolol tartrate 25 mg daily.  On further questioning, he says that he does have some fatigue during the day.  Pressure is soft today at 90/60.  I have asked him to consider taking half a tablet twice a day instead of a whole tablet once a day given that this is a short acting medication.  He otherwise remains on aspirin, Plavix, Zetia, fibroid, and statin therapy.  2.  Essential hypertension: Blood pressure is low today.  He says it typically runs in the 120s.  Adjusting beta-blocker as above.  3.  Hyperlipidemia: LDL was at goal when checked by primary care last August.  He is due for for an annual physical and fasting labs again this August.  He remains on statin, fibroid, and Zetia therapy.  4.  Diet-controlled diabetes mellitus: A1c was 6.7 last year.  5.  Disposition: Follow-up in 1 year or sooner if necessary.   Nicolasa Duckinghristopher Orestes Geiman, NP 06/03/2018, 12:45 PM

## 2018-06-03 NOTE — Patient Instructions (Addendum)
Medication Instructions: - Your physician has recommended you make the following change in your medication:   1) Take lospressor (metoprolol tartrate)25 mg- 1/2 tablet (12.5 mg) twice daily  Refills have been sent to the pharmacy  Labwork: - none ordered  Procedures/Testing: - none ordered  Follow-Up: - Your physician recommends that you schedule a follow-up appointment in: 1 year with Dr. Mariah MillingGollan- we will be in touch with you to schedule.   Any Additional Special Instructions Will Be Listed Below (If Applicable).     If you need a refill on your cardiac medications before your next appointment, please call your pharmacy.

## 2019-02-15 ENCOUNTER — Other Ambulatory Visit: Payer: Self-pay | Admitting: Cardiovascular Disease

## 2019-03-07 ENCOUNTER — Telehealth: Payer: Self-pay | Admitting: Cardiovascular Disease

## 2019-03-07 NOTE — Telephone Encounter (Signed)
I have not seen him for 2 years Needs evisit for preop, also BP low back in 05/2019

## 2019-03-07 NOTE — Telephone Encounter (Signed)
° °  [facility] Pre-operative Risk Assessment    Request for surgical clearance:  1. What type of surgery is being performed? Colonoscopy   2. When is this surgery scheduled? TBD  3. What type of clearance is required (medical clearance vs. Pharmacy clearance to hold med vs. Both)? Both  4. Are there any medications that need to be held prior to surgery and how long? plavix please advise   5. Practice name and name of physician performing surgery? [facility] Dr. Alice Reichert   6. What is your office phone number [phone]   7.   What is your office fax number [phone]  8.   Anesthesia type (None, local, MAC, general) ? Monitored    Sean [name] 03/07/2019, 3:40 PM  _________________________________________________________________   (provider comments below)

## 2019-03-08 NOTE — Telephone Encounter (Signed)
Spoke with patient.  Made him aware that he has not seen in a little while and that Dr. Mariah Milling would like him to be seen for a preop clearance.  Patient stated that he did have a smartphone.  Patient stated that he would like to hold off on making an appointment at this time because they are not certain that he will need the procedure. He stated that they was still running tests and those tests would determine if he needs the procedure or not.  I made him aware to just call us back and let us know when he is ready to schedule.

## 2019-03-10 ENCOUNTER — Other Ambulatory Visit: Payer: Self-pay | Admitting: *Deleted

## 2019-03-10 MED ORDER — FENOFIBRATE 160 MG PO TABS: 160.0000 mg | ORAL_TABLET | Freq: Every day | ORAL | 0 refills | Status: AC

## 2019-03-13 ENCOUNTER — Other Ambulatory Visit
Admission: RE | Admit: 2019-03-13 | Discharge: 2019-03-13 | Disposition: A | Discharge status: Home / Self Care | Payer: Medicare Other | Source: Ambulatory Visit | Attending: Gastroenterology | Admitting: Gastroenterology

## 2019-03-13 DIAGNOSIS — R197 Diarrhea, unspecified: Secondary | ICD-10-CM | POA: Diagnosis present

## 2019-03-13 LAB — GASTROINTESTINAL PANEL BY PCR, STOOL (REPLACES STOOL CULTURE)

## 2019-03-14 LAB — CALPROTECTIN, FECAL: Calprotectin, Fecal: 355 ug/g — ABNORMAL HIGH (ref 0–120)

## 2019-03-17 LAB — PANCREATIC ELASTASE, FECAL: Pancreatic Elastase-1, Stool: 179 ug Elast./g — ABNORMAL LOW (ref >200)

## 2019-05-03 ENCOUNTER — Telehealth: Payer: Self-pay

## 2019-05-03 MED ORDER — CLOPIDOGREL BISULFATE 75 MG PO TABS: 75.0000 mg | ORAL_TABLET | Freq: Every day | ORAL | 0 refills | Status: AC

## 2019-05-03 NOTE — Telephone Encounter (Signed)
Requested Prescriptions   Signed Prescriptions Disp Refills   clopidogrel (PLAVIX) 75 MG tablet 90 tablet 0    Sig: Take 1 tablet (75 mg total) by mouth daily.    Authorizing Provider: BERGE, CHRISTOPHER RONALD    Ordering User: NEWCOMER MCCLAIN, Lacresia Darwish L    

## 2019-05-24 ENCOUNTER — Telehealth: Payer: Self-pay | Admitting: Cardiovascular Disease

## 2019-05-24 NOTE — Telephone Encounter (Signed)
Patient's last PCI to RCA was in 2007, last myoview was normal in 12/2016. Dr. Rockey Situ or Dr. Saunders Revel, ok with you to hold plavix for 7 days prior to colonoscopy?

## 2019-05-24 NOTE — Telephone Encounter (Signed)
I am uncertain as to what type of stent was implanted in 2007 (could be Taxus, which is at increased risk for late stent thrombosis).  However, given that he is >10 years out from PCI, I think it would be reasonable to hold clopidogrel for 5 days prior to procedure and restart it as soon as it is felt safe from a GI standpoint.  ASA 81 mg daily should be continued in the periprocedural period.  Nelva Bush, MD [facility] Pager: [phone]

## 2019-05-24 NOTE — Telephone Encounter (Signed)
   Primary Cardiologist: Ida Rogue, MD  Chart reviewed as part of pre-operative protocol coverage. Patient was contacted 05/24/2019 in reference to pre-operative risk assessment for pending surgery as outlined below.  Sean [name] was last seen on 06/04/2019 by Ignacia Bayley NP.  Since that day, Sean [name] has done well without chest pain or shortness of breath.  Therefore, based on ACC/AHA guidelines, the patient would be at acceptable risk for the planned procedure without further cardiovascular testing.   I will route this recommendation to the requesting party via Epic fax function and remove from pre-op pool.  Please call with questions. Case reviewed with Dr. Saunders Revel, who recommended to hold Plavix for 5 days prior to the procedure and continue aspirin. He will need to restart Plavix after the procedure as soon as possible at the discretion of the surgeon. Patient has been informed of this recommendation.  [place], [place] 05/24/2019, 4:31 PM

## 2019-05-24 NOTE — Telephone Encounter (Signed)
   [facility] Pre-operative Risk Assessment    Request for surgical clearance:  1. What type of surgery is being performed? COLONOSCOPY  2. When is this surgery scheduled? TBD  3. What type of clearance is required (medical clearance vs. Pharmacy clearance to hold med vs. Both)? BOTH  4. Are there any medications that need to be held prior to surgery and how long? PLAVIX, HOW LONG NOT LISTED  5. Practice name and name of physician performing surgery? [facility]   6. What is your office phone number [phone]   7.   What is your office fax number? [phone]  8.   Anesthesia type (None, local, MAC, general) ? Duayne Cal 05/24/2019, 8:12 AM  _________________________________________________________________   (provider comments below)

## 2019-06-05 ENCOUNTER — Telehealth: Payer: Self-pay

## 2019-06-05 MED ORDER — SIMVASTATIN 80 MG PO TABS: 80.0000 mg | ORAL_TABLET | Freq: Every day | ORAL | 0 refills | Status: AC

## 2019-06-05 NOTE — Telephone Encounter (Signed)
Requested Prescriptions   Signed Prescriptions Disp Refills  . simvastatin (ZOCOR) 80 MG tablet 90 tablet 0    Sig: Take 1 tablet (80 mg total) by mouth at bedtime.    Authorizing Provider: Theora Gianotti    Ordering User: Raelene Bott, Eleana Tocco L

## 2019-06-09 ENCOUNTER — Other Ambulatory Visit: Payer: Self-pay

## 2019-06-09 ENCOUNTER — Other Ambulatory Visit
Admission: RE | Admit: 2019-06-09 | Discharge: 2019-06-09 | Disposition: A | Discharge status: Home / Self Care | Payer: Medicare Other | Source: Ambulatory Visit | Attending: Internal Medicine | Admitting: Internal Medicine

## 2019-06-09 DIAGNOSIS — Z01812 Encounter for preprocedural laboratory examination: Secondary | ICD-10-CM | POA: Diagnosis present

## 2019-06-09 DIAGNOSIS — Z1159 Encounter for screening for other viral diseases: Secondary | ICD-10-CM | POA: Diagnosis not present

## 2019-06-10 LAB — SARS CORONAVIRUS 2 (TAT 6-24 HRS): SARS Coronavirus 2: NEGATIVE

## 2019-06-12 ENCOUNTER — Encounter: Payer: Self-pay | Admitting: *Deleted

## 2019-06-13 ENCOUNTER — Ambulatory Visit: Payer: Medicare Other | Admitting: Anesthesiology

## 2019-06-13 ENCOUNTER — Encounter
Admission: RE | Disposition: A | Discharge status: Home / Self Care | Payer: Self-pay | Source: Home / Self Care | Attending: Internal Medicine

## 2019-06-13 ENCOUNTER — Ambulatory Visit
Admission: RE | Admit: 2019-06-13 | Discharge: 2019-06-13 | Disposition: A | Discharge status: Home / Self Care | Payer: Medicare Other | Attending: Internal Medicine | Admitting: Internal Medicine

## 2019-06-13 ENCOUNTER — Other Ambulatory Visit: Payer: Self-pay

## 2019-06-13 DIAGNOSIS — Z6829 Body mass index (BMI) 29.0-29.9, adult: Secondary | ICD-10-CM | POA: Diagnosis not present

## 2019-06-13 DIAGNOSIS — K529 Noninfective gastroenteritis and colitis, unspecified: Secondary | ICD-10-CM | POA: Insufficient documentation

## 2019-06-13 DIAGNOSIS — R634 Abnormal weight loss: Secondary | ICD-10-CM | POA: Diagnosis not present

## 2019-06-13 DIAGNOSIS — R1030 Lower abdominal pain, unspecified: Secondary | ICD-10-CM | POA: Insufficient documentation

## 2019-06-13 DIAGNOSIS — K641 Second degree hemorrhoids: Secondary | ICD-10-CM | POA: Diagnosis not present

## 2019-06-13 DIAGNOSIS — K5730 Diverticulosis of large intestine without perforation or abscess without bleeding: Secondary | ICD-10-CM | POA: Insufficient documentation

## 2019-06-13 HISTORY — DX: Hyperlipidemia, unspecified: E78.5

## 2019-06-13 HISTORY — DX: Unspecified visual disturbance: H53.9

## 2019-06-13 HISTORY — PX: COLONOSCOPY: SHX5424

## 2019-06-13 SURGERY — COLONOSCOPY
Anesthesia: General

## 2019-06-13 MED ORDER — PROPOFOL 500 MG/50ML IV EMUL
INTRAVENOUS | Status: AC
  Filled 2019-06-13: qty 50

## 2019-06-13 MED ORDER — SODIUM CHLORIDE 0.9 % IV SOLN
INTRAVENOUS | Status: DC
  Administered 2019-06-13: 09:00:00 via INTRAVENOUS

## 2019-06-13 MED ORDER — PROPOFOL 10 MG/ML IV BOLUS
INTRAVENOUS | Status: DC | PRN
  Administered 2019-06-13: 70 mg via INTRAVENOUS

## 2019-06-13 MED ORDER — PROPOFOL 500 MG/50ML IV EMUL
INTRAVENOUS | Status: DC | PRN
  Administered 2019-06-13: 125 ug/kg/min via INTRAVENOUS

## 2019-06-13 NOTE — H&P (Signed)
Outpatient short stay form Pre-procedure 06/13/2019 8:14 AM Jurni Cesaro K. Norma Fredricksonoledo, M.D.  Primary Physician: [name], M.D.  Reason for visit:  Abdominal pain, diarrhea, hx diverticulitis.  History of present illness:  AS above. No bleeding, No weight loss. Last colonoscopy in 2011 showed diverticulosis.    Current Facility-Administered Medications:  .  0.9 %  sodium chloride infusion, , Intravenous, Continuous, Kyarah Enamorado, Boykin Nearingeodoro K, MD  Medications Prior to Admission  Medication Sig Dispense Refill Last Dose  . Acetaminophen (TYLENOL PO) Take by mouth as needed.   06/12/2019 at Unknown time  . cyanocobalamin 1000 MCG tablet Take 1,000 mcg by mouth daily.   Past Week at Unknown time  . ezetimibe (ZETIA) 10 MG tablet Take 1 tablet (10 mg total) by mouth daily. 90 tablet 3 06/12/2019 at Unknown time  . fenofibrate 160 MG tablet Take 1 tablet (160 mg total) by mouth daily. 90 tablet 0 06/12/2019 at Unknown time  . GLUCOSAMINE-CHONDROITIN PO Take by mouth daily. 2 CAPS DAILY    Past Month at Unknown time  . metoprolol tartrate (LOPRESSOR) 25 MG tablet Take 1/2 tablet (12.5 mg) by mouth twice daily 90 tablet 3 06/12/2019 at Unknown time  . Multiple Vitamin (MULTIVITAMIN) tablet Take 1 tablet by mouth daily.     Past Week at Unknown time  . pantoprazole (PROTONIX) 40 MG tablet TAKE 1 TABLET BY MOUTH ONCE A DAY 90 tablet 1 Past Week at Unknown time  . PARoxetine (PAXIL) 10 MG tablet Take 5 mg by mouth every morning.     06/12/2019 at Unknown time  . simvastatin (ZOCOR) 80 MG tablet Take 1 tablet (80 mg total) by mouth at bedtime. 90 tablet 0 06/12/2019 at Unknown time  . tamsulosin (FLOMAX) 0.4 MG CAPS capsule Take 0.4 mg by mouth daily.  10 06/12/2019 at Unknown time  . traZODone (DESYREL) 50 MG tablet Take 50 mg by mouth at bedtime.     Marland Kitchen. aspirin EC 81 MG tablet Take 81 mg by mouth daily.   06/08/2019  . clopidogrel (PLAVIX) 75 MG tablet Take 1 tablet (75 mg total) by mouth daily. 90 tablet 0 06/08/2019   . nitroGLYCERIN (NITROSTAT) 0.4 MG SL tablet Place 1 tablet (0.4 mg total) under the tongue every 5 (five) minutes as needed. 25 tablet 1      Allergies  Allergen Reactions  . Ciprofloxacin   . Quinolones   . Fibrates Rash     Past Medical History:  Diagnosis Date  . Chest pain   . Coronary artery disease    a. 01/2006 PCI/DES to RCA; b. 01/2010 MV: No ischemia; c. 2013 MV: No ischemia; d. 12/2016 MV: No ischemia, EF 63%.  . Depression   . Diabetes mellitus without complication (HCC)   . Diarrhea   . Diverticulitis   . GERD (gastroesophageal reflux disease)   . Hypercholesterolemia   . Hyperlipidemia   . Hypertension   . Nausea & vomiting   . Obesity   . TIA (transient ischemic attack) 2009  . Vision abnormalities     Review of systems:  Otherwise negative.    Physical Exam  Gen: Alert, oriented. Appears stated age.  HEENT: [place]/AT. PERRLA. Lungs: CTA, no wheezes. CV: RR nl S1, S2. Abd: soft, benign, no masses. BS+ Ext: No edema. Pulses 2+    Planned procedures: Proceed with  colonoscopy. The patient understands the nature of the planned procedure, indications, risks, alternatives and potential complications including but not limited to bleeding, infection, perforation, damage to  internal organs and possible oversedation/side effects from anesthesia. The patient agrees and gives consent to proceed.  Please refer to procedure notes for findings, recommendations and patient disposition/instructions.     Caylen Yardley K. Alice Reichert, M.D. Gastroenterology 06/13/2019  8:14 AM

## 2019-06-13 NOTE — Anesthesia Post-op Follow-up Note (Signed)
Anesthesia QCDR form completed.        

## 2019-06-13 NOTE — Op Note (Signed)
[facility] Gastroenterology Patient Name: Sean [name] Procedure Date: 06/13/2019 8:50 AM MRN: [id number] Account #: [id number] Date of Birth: [birth year] Admit Type: Outpatient Age: [age] Room: [facility] ENDO ROOM 2 Gender: Male Note Status: Finalized Procedure:            Colonoscopy Indications:          Lower abdominal pain, Chronic diarrhea, Weight loss Providers:            Boykin Nearingeodoro K. Toledo MD, MD Medicines:            Propofol per Anesthesia Complications:        No immediate complications. Procedure:            Pre-Anesthesia Assessment:                       - The risks and benefits of the procedure and the                        sedation options and risks were discussed with the                        patient. All questions were answered and informed                        consent was obtained.                       - Patient identification and proposed procedure were                        verified prior to the procedure by the nurse. The                        procedure was verified in the procedure room.                       - ASA Grade Assessment: III - A patient with severe                        systemic disease.                       - After reviewing the risks and benefits, the patient                        was deemed in satisfactory condition to undergo the                        procedure.                       After obtaining informed consent, the colonoscope was                        passed under direct vision. Throughout the procedure,                        the patient's blood pressure, pulse, and oxygen                        saturations were monitored continuously. The  Colonoscope was introduced through the anus and                        advanced to the the terminal ileum, with identification                        of the appendiceal orifice and IC valve. The                        colonoscopy was performed without  difficulty. The                        patient tolerated the procedure well. The quality of                        the bowel preparation was good. The terminal ileum,                        ileocecal valve, appendiceal orifice, and rectum were                        photographed. Findings:      The perianal and digital rectal examinations were normal. Pertinent       negatives include normal sphincter tone and no palpable rectal lesions.      Multiple small and large-mouthed diverticula were found in the sigmoid       colon.      Localized moderate mucosal changes characterized by congestion (edema)       and erythema were found in the sigmoid colon. Biopsies were taken with a       cold forceps for histology.      Non-bleeding internal hemorrhoids were found during retroflexion. The       hemorrhoids were Grade II (internal hemorrhoids that prolapse but reduce       spontaneously).      The exam was otherwise normal throughout the examined colon.      Biopsies for histology were taken with a cold forceps from the random       colon for evaluation of microscopic colitis.      The ileum, 5 cm from the ileocecal valve contained two       semi-pedunculated, non-bleeding polyps. The polyps were 5 to 12 mm in       diameter. The polyp was removed with a cold snare. Resection and       retrieval were complete. To prevent bleeding after the polypectomy, one       hemostatic clip was successfully placed (MR conditional). There was no       bleeding at the end of the procedure.      The exam was otherwise without abnormality.      The remainder of the exam in the terminal ileum was normal. Impression:           - Diverticulosis in the sigmoid colon.                       - Localized moderate mucosal changes were found in the                        sigmoid colon secondary to colitis. Biopsied.                       -  Non-bleeding internal hemorrhoids.                       - Two polyps in the  ileum, 5 cm from the ileocecal                        valve, removed with a cold snare. Resected and                        retrieved. Clip (MR conditional) was placed.                       - The examination was otherwise normal.                       - Biopsies were taken with a cold forceps from the                        random colon for evaluation of microscopic colitis. Recommendation:       - Patient has a contact number available for                        emergencies. The signs and symptoms of potential                        delayed complications were discussed with the patient.                        Return to normal activities tomorrow. Written discharge                        instructions were provided to the patient.                       - Resume previous diet.                       - Continue present medications.                       - Await pathology results.                       - Return to physician assistant in 2 weeks.                       - You will be seen by Jacob MooresMason Croley, PA-C for your                        follow up visit in the office.                       - Augmentin (amoxicillin/clavulanate) 875 mg PO BID for                        2 weeks.                       - The findings and recommendations were discussed with                        the  patient. Procedure Code(s):    --- Professional ---                       [phone], Colonoscopy, flexible; with removal of tumor(s),                        polyp(s), or other lesion(s) by snare technique                       45380, 61, Colonoscopy, flexible; with biopsy, single                        or multiple Diagnosis Code(s):    --- Professional ---                       K57.30, Diverticulosis of large intestine without                        perforation or abscess without bleeding                       R63.4, Abnormal weight loss                       R10.30, Lower abdominal pain, unspecified                        D13.39, Benign neoplasm of other parts of small                        intestine                       K52.9, Noninfective gastroenteritis and colitis,                        unspecified                       K64.1, Second degree hemorrhoids CPT copyright 2019 [employer]. All rights reserved. The codes documented in this report are preliminary and upon coder review may  be revised to meet current compliance requirements. Efrain Sella MD, MD 06/13/2019 9:39:57 AM This report has been signed electronically. Number of Addenda: 0 Note Initiated On: 06/13/2019 8:50 AM Scope Withdrawal Time: 0 hours 24 minutes 27 seconds  Total Procedure Duration: 0 hours 28 minutes 30 seconds  Estimated Blood Loss: Estimated blood loss was minimal.      [facility]

## 2019-06-13 NOTE — Transfer of Care (Signed)
Immediate Anesthesia Transfer of Care Note  Patient: Sean [name]  Procedure(s) Performed: COLONOSCOPY (N/A )  Patient Location: PACU  Anesthesia Type:General  Level of Consciousness: awake, alert  and oriented  Airway & Oxygen Therapy: Patient Spontanous Breathing and Patient connected to nasal cannula oxygen  Post-op Assessment: Report given to RN and Post -op Vital signs reviewed and stable  Post vital signs: Reviewed and stable  Last Vitals:  Vitals Value Taken Time  BP 115/80 06/13/19 0936  Temp 36.4 C 06/13/19 0936  Pulse 78 06/13/19 0936  Resp 11 06/13/19 0936  SpO2 96 % 06/13/19 0936  Vitals shown include unvalidated device data.  Last Pain:  Vitals:   06/13/19 0936  TempSrc:   PainSc: 0-No pain         Complications: No apparent anesthesia complications

## 2019-06-13 NOTE — Anesthesia Preprocedure Evaluation (Signed)
Anesthesia Evaluation  Patient identified by MRN, date of birth, ID band Patient awake    Reviewed: Allergy & Precautions, H&P , NPO status , Patient's Chart, lab work & pertinent test results, reviewed documented beta blocker date and time   Airway Mallampati: II   Neck ROM: full    Dental  (+) Poor Dentition   Pulmonary neg pulmonary ROS, former smoker,    Pulmonary exam normal        Cardiovascular hypertension, + angina with exertion + CAD  Normal cardiovascular exam Rhythm:regular Rate:Normal     Neuro/Psych PSYCHIATRIC DISORDERS Depression TIA   GI/Hepatic Neg liver ROS, GERD  ,  Endo/Other  negative endocrine ROSdiabetes, Well Controlled, Type 2, Oral Hypoglycemic Agents  Renal/GU negative Renal ROS  negative genitourinary   Musculoskeletal   Abdominal   Peds  Hematology negative hematology ROS (+)   Anesthesia Other Findings Past Medical History: No date: Chest pain No date: Coronary artery disease     Comment:  a. 01/2006 PCI/DES to RCA; b. 01/2010 MV: No ischemia; c.               2013 MV: No ischemia; d. 12/2016 MV: No ischemia, EF 63%. No date: Depression No date: Diabetes mellitus without complication (HCC) No date: Diarrhea No date: Diverticulitis No date: GERD (gastroesophageal reflux disease) No date: Hypercholesterolemia No date: Hyperlipidemia No date: Hypertension No date: Nausea & vomiting No date: Obesity 2009: TIA (transient ischemic attack) No date: Vision abnormalities Past Surgical History: No date: APPENDECTOMY No date: CARDIAC CATHETERIZATION No date: COLONOSCOPY No date: EYE SURGERY     Comment:  Eyelid surgery No date: LUMBAR FUSION No date: Vitreous retinal surgery   Reproductive/Obstetrics negative OB ROS                             Anesthesia Physical Anesthesia Plan  ASA: III  Anesthesia Plan: General   Post-op Pain Management:     Induction:   PONV Risk Score and Plan:   Airway Management Planned:   Additional Equipment:   Intra-op Plan:   Post-operative Plan:   Informed Consent: I have reviewed the patients History and Physical, chart, labs and discussed the procedure including the risks, benefits and alternatives for the proposed anesthesia with the patient or authorized representative who has indicated his/her understanding and acceptance.     Dental Advisory Given  Plan Discussed with: CRNA  Anesthesia Plan Comments:         Anesthesia Quick Evaluation

## 2019-06-14 ENCOUNTER — Encounter: Payer: Self-pay | Admitting: Internal Medicine

## 2019-06-14 NOTE — Anesthesia Postprocedure Evaluation (Signed)
Anesthesia Post Note  Patient: Sean [name]  Procedure(s) Performed: COLONOSCOPY (N/A )  Patient location during evaluation: PACU Anesthesia Type: General Level of consciousness: awake and alert Pain management: pain level controlled Vital Signs Assessment: post-procedure vital signs reviewed and stable Respiratory status: spontaneous breathing, nonlabored ventilation, respiratory function stable and patient connected to nasal cannula oxygen Cardiovascular status: blood pressure returned to baseline and stable Postop Assessment: no apparent nausea or vomiting Anesthetic complications: no     Last Vitals:  Vitals:   06/13/19 0956 06/13/19 1006  BP: 130/86 (!) 156/103  Pulse: 65 66  Resp: 13 20  Temp:    SpO2: 95% 96%    Last Pain:  Vitals:   06/14/19 0711  TempSrc:   PainSc: 0-No pain                 Molli Barrows

## 2019-06-19 LAB — SURGICAL PATHOLOGY

## 2019-06-29 ENCOUNTER — Other Ambulatory Visit
Admission: RE | Admit: 2019-06-29 | Discharge: 2019-06-29 | Disposition: A | Discharge status: Home / Self Care | Payer: Medicare Other | Source: Ambulatory Visit | Attending: Gastroenterology | Admitting: Gastroenterology

## 2019-06-29 DIAGNOSIS — K5732 Diverticulitis of large intestine without perforation or abscess without bleeding: Secondary | ICD-10-CM | POA: Insufficient documentation

## 2019-06-29 LAB — CBC WITH DIFFERENTIAL/PLATELET
Abs Immature Granulocytes: 0.02 10*3/uL (ref 0.00–0.07)
Basophils Absolute: 0.1 10*3/uL (ref 0.0–0.1)
Basophils Relative: 1 %
Eosinophils Absolute: 0.1 10*3/uL (ref 0.0–0.5)
Eosinophils Relative: 2 %
HCT: 44.8 % (ref 39.0–52.0)
Hemoglobin: 15.1 g/dL (ref 13.0–17.0)
Immature Granulocytes: 0 %
Lymphocytes Relative: 25 %
Lymphs Abs: 2 10*3/uL (ref 0.7–4.0)
MCH: 31.2 pg (ref 26.0–34.0)
MCHC: 33.7 g/dL (ref 30.0–36.0)
MCV: 92.6 fL (ref 80.0–100.0)
Monocytes Absolute: 0.6 10*3/uL (ref 0.1–1.0)
Monocytes Relative: 7 %
Neutro Abs: 5.3 10*3/uL (ref 1.7–7.7)
Neutrophils Relative %: 65 %
Platelets: 243 10*3/uL (ref 150–400)
RBC: 4.84 MIL/uL (ref 4.22–5.81)
RDW: 12.4 % (ref 11.5–15.5)
WBC: 8 10*3/uL (ref 4.0–10.5)
nRBC: 0 % (ref 0.0–0.2)

## 2019-06-29 LAB — COMPREHENSIVE METABOLIC PANEL
ALT: 22 U/L (ref 0–44)
AST: 24 U/L (ref 15–41)
Albumin: 3.8 g/dL (ref 3.5–5.0)
Alkaline Phosphatase: 32 U/L — ABNORMAL LOW (ref 38–126)
Anion gap: 8 (ref 5–15)
BUN: 22 mg/dL (ref 8–23)
CO2: 26 mmol/L (ref 22–32)
Calcium: 9.4 mg/dL (ref 8.9–10.3)
Chloride: 105 mmol/L (ref 98–111)
Creatinine, Ser: 1.18 mg/dL (ref 0.61–1.24)
GFR calc Af Amer: >60 mL/min (ref >60)
GFR calc non Af Amer: >60 mL/min (ref >60)
Glucose, Bld: 86 mg/dL (ref 70–99)
Potassium: 4.4 mmol/L (ref 3.5–5.1)
Sodium: 139 mmol/L (ref 135–145)
Total Bilirubin: 1 mg/dL (ref 0.3–1.2)
Total Protein: 6.5 g/dL (ref 6.5–8.1)

## 2019-07-03 ENCOUNTER — Other Ambulatory Visit: Payer: Self-pay

## 2019-07-03 MED ORDER — EZETIMIBE 10 MG PO TABS: 10.0000 mg | ORAL_TABLET | Freq: Every day | ORAL | 3 refills | Status: AC

## 2019-07-22 NOTE — Progress Notes (Deleted)
No show

## 2019-07-24 ENCOUNTER — Ambulatory Visit: Payer: Medicare Other | Admitting: Cardiovascular Disease

## 2019-08-01 DEATH — deceased
